# Patient Record
Sex: Female | Born: 1985
Health system: Southern US, Community
[De-identification: ages and names within clinical notes are randomized; demographics above are authoritative.]

## PROBLEM LIST (undated history)

## (undated) DIAGNOSIS — G8929 Other chronic pain: Secondary | ICD-10-CM

## (undated) DIAGNOSIS — Z9889 Other specified postprocedural states: Secondary | ICD-10-CM

## (undated) DIAGNOSIS — F419 Anxiety disorder, unspecified: Secondary | ICD-10-CM

## (undated) DIAGNOSIS — G43909 Migraine, unspecified, not intractable, without status migrainosus: Secondary | ICD-10-CM

## (undated) DIAGNOSIS — G4730 Sleep apnea, unspecified: Secondary | ICD-10-CM

## (undated) DIAGNOSIS — M545 Low back pain, unspecified: Secondary | ICD-10-CM

## (undated) DIAGNOSIS — M542 Cervicalgia: Secondary | ICD-10-CM

## (undated) DIAGNOSIS — M1990 Unspecified osteoarthritis, unspecified site: Secondary | ICD-10-CM

## (undated) DIAGNOSIS — R112 Nausea with vomiting, unspecified: Secondary | ICD-10-CM

## (undated) DIAGNOSIS — F32A Depression, unspecified: Secondary | ICD-10-CM

## (undated) DIAGNOSIS — Z8489 Family history of other specified conditions: Secondary | ICD-10-CM

## (undated) DIAGNOSIS — J189 Pneumonia, unspecified organism: Secondary | ICD-10-CM

## (undated) HISTORY — DX: Pneumonia, unspecified organism: J18.9

## (undated) HISTORY — DX: Low back pain: M54.5

## (undated) HISTORY — PX: ABDOMINAL HYSTERECTOMY: SHX81

## (undated) HISTORY — DX: Depression, unspecified: F32.A

## (undated) HISTORY — DX: Cervicalgia: M54.2

## (undated) HISTORY — DX: Low back pain, unspecified: M54.50

## (undated) HISTORY — DX: Other chronic pain: G89.29

## (undated) HISTORY — DX: Sleep apnea, unspecified: G47.30

## (undated) HISTORY — PX: WISDOM TOOTH EXTRACTION: SHX21

## (undated) HISTORY — DX: Anxiety disorder, unspecified: F41.9

## (undated) HISTORY — DX: Unspecified osteoarthritis, unspecified site: M19.90

---

## 2003-12-04 DIAGNOSIS — J189 Pneumonia, unspecified organism: Secondary | ICD-10-CM

## 2003-12-04 HISTORY — DX: Pneumonia, unspecified organism: J18.9

## 2008-01-15 ENCOUNTER — Emergency Department (HOSPITAL_COMMUNITY): Admission: EM | Admit: 2008-01-15 | Discharge: 2008-01-15 | Payer: Self-pay | Admitting: Emergency Medicine

## 2009-03-22 ENCOUNTER — Emergency Department (HOSPITAL_COMMUNITY): Admission: EM | Admit: 2009-03-22 | Discharge: 2009-03-22 | Payer: Self-pay | Admitting: Emergency Medicine

## 2010-02-13 ENCOUNTER — Emergency Department (HOSPITAL_COMMUNITY): Admission: EM | Admit: 2010-02-13 | Discharge: 2010-02-13 | Payer: Self-pay | Admitting: Emergency Medicine

## 2010-11-09 ENCOUNTER — Emergency Department (HOSPITAL_COMMUNITY): Admission: EM | Admit: 2010-11-09 | Discharge: 2010-01-19 | Payer: Self-pay | Admitting: Emergency Medicine

## 2011-08-24 LAB — RAPID STREP SCREEN (MED CTR MEBANE ONLY): Streptococcus, Group A Screen (Direct): NEGATIVE

## 2011-12-15 ENCOUNTER — Emergency Department (HOSPITAL_COMMUNITY)
Admission: EM | Admit: 2011-12-15 | Discharge: 2011-12-16 | Discharge status: Against Medical Advice | Payer: Self-pay | Attending: Emergency Medicine | Admitting: Emergency Medicine

## 2011-12-15 ENCOUNTER — Encounter (HOSPITAL_COMMUNITY): Payer: Self-pay | Admitting: *Deleted

## 2011-12-15 DIAGNOSIS — J3489 Other specified disorders of nose and nasal sinuses: Secondary | ICD-10-CM | POA: Insufficient documentation

## 2011-12-15 NOTE — ED Notes (Signed)
Reports throat drainage, body aches, dry cough, nasal congestion x several days.  Denies fever/chills.  deneis N/V-reports diarrhea.

## 2011-12-16 LAB — RAPID STREP SCREEN (MED CTR MEBANE ONLY): Streptococcus, Group A Screen (Direct): NEGATIVE

## 2011-12-16 NOTE — ED Notes (Signed)
Patient's friend is upset about the wait time and wants to leave.  Explained to her the acuity of how patient's are seen.  Person has been advised about 4 times and apologized for the wait time.  Asked them if there was anything needed at this time but nothing except wanting to see a provider.  Patient was given a warm blanket and she refused anything to drink or eat.

## 2011-12-16 NOTE — ED Notes (Signed)
Patient not in the room when NP went to see patient.

## 2012-07-12 ENCOUNTER — Emergency Department (HOSPITAL_COMMUNITY)
Admission: EM | Admit: 2012-07-12 | Discharge: 2012-07-12 | Disposition: A | Discharge status: Home / Self Care | Payer: Self-pay | Attending: Emergency Medicine | Admitting: Emergency Medicine

## 2012-07-12 ENCOUNTER — Encounter (HOSPITAL_COMMUNITY): Payer: Self-pay | Admitting: Emergency Medicine

## 2012-07-12 DIAGNOSIS — R51 Headache: Secondary | ICD-10-CM | POA: Insufficient documentation

## 2012-07-12 DIAGNOSIS — F17200 Nicotine dependence, unspecified, uncomplicated: Secondary | ICD-10-CM | POA: Insufficient documentation

## 2012-07-12 HISTORY — DX: Migraine, unspecified, not intractable, without status migrainosus: G43.909

## 2012-07-12 MED ORDER — DIPHENHYDRAMINE HCL 50 MG/ML IJ SOLN
25.0000 mg | Freq: Once | INTRAMUSCULAR | Status: AC
  Administered 2012-07-12: 25 mg via INTRAVENOUS
  Filled 2012-07-12: qty 1

## 2012-07-12 MED ORDER — SODIUM CHLORIDE 0.9 % IV BOLUS (SEPSIS)
1000.0000 mL | Freq: Once | INTRAVENOUS | Status: AC
  Administered 2012-07-12: 1000 mL via INTRAVENOUS

## 2012-07-12 MED ORDER — KETOROLAC TROMETHAMINE 30 MG/ML IJ SOLN
INTRAMUSCULAR | Status: AC
  Administered 2012-07-12: 15 mg
  Filled 2012-07-12: qty 1

## 2012-07-12 MED ORDER — METOCLOPRAMIDE HCL 5 MG/ML IJ SOLN
10.0000 mg | Freq: Once | INTRAMUSCULAR | Status: AC
  Administered 2012-07-12: 10 mg via INTRAVENOUS
  Filled 2012-07-12: qty 2

## 2012-07-12 MED ORDER — KETOROLAC TROMETHAMINE 15 MG/ML IJ SOLN
15.0000 mg | Freq: Once | INTRAMUSCULAR | Status: AC
  Filled 2012-07-12: qty 1

## 2012-07-12 NOTE — ED Notes (Signed)
Pt states she has a migraine headache and is very sensitive to light and her neck is feeling stiff  Denies feeling like she has a fever  Pt states she has had some nausea without vomiting  Pt states sxs started 2 days ago

## 2012-07-12 NOTE — ED Notes (Signed)
Patient is alert and oriented x3.  She was given DC instructions and follow up visit instructions.  Patient gave verbal understanding. She was DC ambulatory under his own power to home.  V/S stable.  He was not showing any signs of distress on DC 

## 2012-07-12 NOTE — ED Notes (Signed)
Patient is alert and oriented x3.  She states she has a history of migraines and this  Feels like her previous pain.  She confirms nausea but denies and vomiting

## 2012-07-22 NOTE — ED Provider Notes (Signed)
History    26yF with HA. Gradual onset about 2d ago. Hx of what she calls migraines and feels similar to previous. Denies trauma. Diffuse and achy. Nausea. No vomiting. Photophobia. No numbness, tingling or loss of strength. No fever denies use of blood thinning medication.  CSN: [id number]  Arrival date & time 07/12/12  0506   First MD Initiated Contact with Patient 07/12/12 0600      Chief Complaint  Patient presents with  . Neck Pain  . Headache    (Consider location/radiation/quality/duration/timing/severity/associated sxs/prior treatment) HPI  Past Medical History  Diagnosis Date  . Migraines     History reviewed. No pertinent past surgical history.  Family History  Problem Relation Age of Onset  . Hypertension Other   . Diabetes Other     History  Substance Use Topics  . Smoking status: Current Everyday Smoker -- 0.2 packs/day    Types: Cigarettes  . Smokeless tobacco: Not on file  . Alcohol Use: Yes     occ    OB History    Grav Para Term Preterm Abortions TAB SAB Ect Mult Living                  Review of Systems   Review of symptoms negative unless otherwise noted in HPI.   Allergies  Review of patient's allergies indicates no known allergies.  Home Medications   Current Outpatient Rx  Name Route Sig Dispense Refill  . IBUPROFEN 200 MG PO TABS Oral Take 800 mg by mouth every 6 (six) hours as needed. For cramps      BP 129/98  Pulse 80  Temp 98.2 F (36.8 C) (Oral)  Resp 16  SpO2 100%  LMP 07/09/2012  Physical Exam  Nursing note and vitals reviewed. Constitutional: She is oriented to person, place, and time. She appears well-developed and well-nourished. No distress.  HENT:  Head: Normocephalic and atraumatic.  Right Ear: External ear normal.  Left Ear: External ear normal.  Mouth/Throat: Oropharynx is clear and moist.  Eyes: Conjunctivae and EOM are normal. Pupils are equal, round, and reactive to light. Right eye exhibits no  discharge. Left eye exhibits no discharge.  Neck: Normal range of motion. Neck supple.  Cardiovascular: Normal rate, regular rhythm and normal heart sounds.  Exam reveals no gallop and no friction rub.   No murmur heard. Pulmonary/Chest: Effort normal and breath sounds normal. No respiratory distress.  Abdominal: Soft. She exhibits no distension. There is no tenderness.  Musculoskeletal: She exhibits no edema and no tenderness.  Lymphadenopathy:    She has cervical adenopathy.  Neurological: She is alert and oriented to person, place, and time. No cranial nerve deficit. She exhibits normal muscle tone. Coordination normal.       Good finger to nose b/l. Gait steady.  Skin: Skin is warm and dry.  Psychiatric: She has a normal mood and affect. Her behavior is normal. Thought content normal.    ED Course  Procedures (including critical care time)  Labs Reviewed - No data to display No results found.   1. Headache       MDM  26yF with HA. Suspect primary HA. Consider emergent secondary causes such as bleed, infectious or mass but doubt. There is no history of trauma. Pt has a nonfocal neurological exam. Afebrile and neck supple. No use of blood thinning medication. Consider ocular etiology such as acute angle closure glaucoma but doubt. Pt denies acute change in visual acuity and eye exam unremarkable. Doubt  temporal arteritis given age, no temporal tenderness and temporal artery pulsations palpable. Doubt CO poisoning. No contacts with similar symptoms. Doubt venous thrombosis. Doubt carotid or vertebral arteries dissection. Symptoms improved with meds. Feel that can be safely discharged, but strict return precautions discussed. Outpt fu.         Raeford Razor, MD 07/22/12 1414

## 2013-07-15 ENCOUNTER — Encounter (HOSPITAL_COMMUNITY): Payer: Self-pay | Admitting: Emergency Medicine

## 2013-07-15 ENCOUNTER — Emergency Department (INDEPENDENT_AMBULATORY_CARE_PROVIDER_SITE_OTHER)
Admission: EM | Admit: 2013-07-15 | Discharge: 2013-07-15 | Disposition: A | Discharge status: Home / Self Care | Payer: 59 | Source: Home / Self Care

## 2013-07-15 DIAGNOSIS — M549 Dorsalgia, unspecified: Secondary | ICD-10-CM

## 2013-07-15 DIAGNOSIS — S39012S Strain of muscle, fascia and tendon of lower back, sequela: Secondary | ICD-10-CM

## 2013-07-15 DIAGNOSIS — M542 Cervicalgia: Secondary | ICD-10-CM

## 2013-07-15 DIAGNOSIS — G8929 Other chronic pain: Secondary | ICD-10-CM

## 2013-07-15 DIAGNOSIS — M791 Myalgia, unspecified site: Secondary | ICD-10-CM

## 2013-07-15 DIAGNOSIS — IMO0002 Reserved for concepts with insufficient information to code with codable children: Secondary | ICD-10-CM

## 2013-07-15 DIAGNOSIS — IMO0001 Reserved for inherently not codable concepts without codable children: Secondary | ICD-10-CM

## 2013-07-15 MED ORDER — TRAMADOL HCL 50 MG PO TABS: 50.0000 mg | ORAL_TABLET | Freq: Four times a day (QID) | ORAL | Status: DC | PRN

## 2013-07-15 MED ORDER — DICLOFENAC POTASSIUM 50 MG PO TABS: 50.0000 mg | ORAL_TABLET | Freq: Three times a day (TID) | ORAL | Status: DC

## 2013-07-15 NOTE — ED Provider Notes (Signed)
Medical screening examination/treatment/procedure(s) were performed by non-physician practitioner and as supervising physician I was immediately available for consultation/collaboration.  Raynald Blend, MD 07/15/13 [phone]

## 2013-07-15 NOTE — ED Notes (Signed)
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

## 2013-07-15 NOTE — ED Provider Notes (Signed)
CSN: [id number]     Arrival date & time 07/15/13  1510 History     First MD Initiated Contact with Patient 07/15/13 1559     Chief Complaint  Patient presents with  . Back Pain   (Consider location/radiation/quality/duration/timing/severity/associated sxs/prior Treatment) HPI Comments: 27 year old female with a 7 year history of parathoracic and para lumbar muscular pain. She is complaining of an exacerbation of pain in his muscles as well as paracervical musculature. She states she strained her back working for [employer] apparently 7 years ago and the pain is chronic intermittent throughout the shears. Her current job requires prolonged standing which exacerbates the pain. Is also worse with pushing and pulling. Denies radicular pain paresthesias or focal muscular weakness. Denies spinal pain or history of injury.   Past Medical History  Diagnosis Date  . Migraines    History reviewed. No pertinent past surgical history. Family History  Problem Relation Age of Onset  . Hypertension Other   . Diabetes Other    History  Substance Use Topics  . Smoking status: Current Every Day Smoker -- 0.20 packs/day    Types: Cigarettes  . Smokeless tobacco: Not on file  . Alcohol Use: Yes     Comment: occ   OB History   Grav Para Term Preterm Abortions TAB SAB Ect Mult Living                 Review of Systems  Constitutional: Positive for activity change. Negative for fever, chills and fatigue.  HENT: Negative.   Respiratory: Negative.   Cardiovascular: Negative.   Musculoskeletal: Positive for myalgias and back pain. Negative for joint swelling.       As per HPI  Skin: Negative for color change, pallor and rash.  Neurological: Negative.     Allergies  Review of patient's allergies indicates no known allergies.  Home Medications   Current Outpatient Rx  Name  Route  Sig  Dispense  Refill  . diclofenac (CATAFLAM) 50 MG tablet   Oral   Take 1 tablet (50 mg total) by mouth 3 (three)  times daily.   21 tablet   0   . ibuprofen (ADVIL,MOTRIN) 200 MG tablet   Oral   Take 800 mg by mouth every 6 (six) hours as needed. For cramps         . traMADol (ULTRAM) 50 MG tablet   Oral   Take 1 tablet (50 mg total) by mouth every 6 (six) hours as needed for pain.   15 tablet   0    BP 118/85  Pulse 78  Temp(Src) 99.2 F (37.3 C) (Oral)  Resp 16  SpO2 100% Physical Exam  Constitutional: She is oriented to person, place, and time. She appears well-developed and well-nourished. No distress.  HENT:  Head: Normocephalic and atraumatic.  Eyes: EOM are normal. Pupils are equal, round, and reactive to light.  Neck: Normal range of motion. Neck supple.  Musculoskeletal: Normal range of motion. She exhibits no edema.  Tenderness along the para lumbar and lower thoracic musculature. No spinal tenderness. Tenderness in the para cervical musculature primarily to the trapezii. No spinal tenderness. No spinal deformity or step off deformity palpated along the length of the spine. Range of motion of the neck is complete. Range of motion of the lower back is also complete but having some discomfort with forward flexion past 60.  Lymphadenopathy:    She has no cervical adenopathy.  Neurological: She is alert and oriented to person,  place, and time. No cranial nerve deficit.  Skin: Skin is warm and dry.  Psychiatric: She has a normal mood and affect.    ED Course   Procedures (including critical care time)  Labs Reviewed - No data to display No results found. 1. Chronic back pain   2. Lumbosacral strain, sequela   3. Cervicalgia   4. Myalgia     MDM  Ultram 50 mg every 6 hours when necessary pain Cataflam 50 mg 3 times a day with food when necessary pain Warm compresses to areas of soreness Limit bending or pulling as much as possible. Perform stretches frequently during the day as discussed and demonstrated. Follow instructions on the papers describing back exercises.  Recommend obtaining a PCP for followup and referral to physical therapy for acute on chronic back pain due to musculoskeletal strain and poor posture.     Hayden Rasmussen, NP 07/15/13 [phone]

## 2013-09-17 ENCOUNTER — Encounter (HOSPITAL_COMMUNITY): Payer: Self-pay | Admitting: Emergency Medicine

## 2013-09-17 ENCOUNTER — Emergency Department (HOSPITAL_COMMUNITY)
Admission: EM | Admit: 2013-09-17 | Discharge: 2013-09-18 | Disposition: A | Discharge status: Home / Self Care | Payer: 59 | Attending: Emergency Medicine | Admitting: Emergency Medicine

## 2013-09-17 DIAGNOSIS — N730 Acute parametritis and pelvic cellulitis: Secondary | ICD-10-CM | POA: Insufficient documentation

## 2013-09-17 DIAGNOSIS — Z8679 Personal history of other diseases of the circulatory system: Secondary | ICD-10-CM | POA: Insufficient documentation

## 2013-09-17 DIAGNOSIS — N390 Urinary tract infection, site not specified: Secondary | ICD-10-CM | POA: Insufficient documentation

## 2013-09-17 DIAGNOSIS — Z792 Long term (current) use of antibiotics: Secondary | ICD-10-CM | POA: Insufficient documentation

## 2013-09-17 DIAGNOSIS — R1110 Vomiting, unspecified: Secondary | ICD-10-CM | POA: Insufficient documentation

## 2013-09-17 DIAGNOSIS — Z3202 Encounter for pregnancy test, result negative: Secondary | ICD-10-CM | POA: Insufficient documentation

## 2013-09-17 DIAGNOSIS — F17200 Nicotine dependence, unspecified, uncomplicated: Secondary | ICD-10-CM | POA: Insufficient documentation

## 2013-09-17 LAB — URINALYSIS, ROUTINE W REFLEX MICROSCOPIC
Glucose, UA: NEGATIVE mg/dL
Specific Gravity, Urine: 1.027 (ref 1.005–1.030)
Urobilinogen, UA: 1 mg/dL (ref 0.0–1.0)
pH: 8 (ref 5.0–8.0)

## 2013-09-17 LAB — URINE MICROSCOPIC-ADD ON

## 2013-09-17 LAB — CBC WITH DIFFERENTIAL/PLATELET
Basophils Absolute: 0 10*3/uL (ref 0.0–0.1)
Basophils Relative: 0 % (ref 0–1)
Eosinophils Absolute: 0 10*3/uL (ref 0.0–0.7)
Eosinophils Relative: 0 % (ref 0–5)
MCH: 31.5 pg (ref 26.0–34.0)
MCV: 90.4 fL (ref 78.0–100.0)
Neutrophils Relative %: 84 % — ABNORMAL HIGH (ref 43–77)
Platelets: 153 10*3/uL (ref 150–400)
RBC: 4.06 MIL/uL (ref 3.87–5.11)
RDW: 13.4 % (ref 11.5–15.5)
WBC: 9.6 10*3/uL (ref 4.0–10.5)

## 2013-09-17 LAB — COMPREHENSIVE METABOLIC PANEL
ALT: 12 U/L (ref 0–35)
AST: 17 U/L (ref 0–37)
Albumin: 4.1 g/dL (ref 3.5–5.2)
Alkaline Phosphatase: 34 U/L — ABNORMAL LOW (ref 39–117)
Calcium: 9.1 mg/dL (ref 8.4–10.5)
GFR calc Af Amer: >90 mL/min (ref >90)
Potassium: 4.2 mEq/L (ref 3.5–5.1)
Sodium: 139 mEq/L (ref 135–145)
Total Protein: 7.2 g/dL (ref 6.0–8.3)

## 2013-09-17 LAB — POCT PREGNANCY, URINE: Preg Test, Ur: NEGATIVE

## 2013-09-17 MED ORDER — SODIUM CHLORIDE 0.9 % IV BOLUS (SEPSIS)
1000.0000 mL | Freq: Once | INTRAVENOUS | Status: AC
  Administered 2013-09-17: 1000 mL via INTRAVENOUS

## 2013-09-17 MED ORDER — ONDANSETRON HCL 4 MG/2ML IJ SOLN
4.0000 mg | Freq: Once | INTRAMUSCULAR | Status: AC
  Administered 2013-09-17: 4 mg via INTRAVENOUS
  Filled 2013-09-17: qty 2

## 2013-09-17 MED ORDER — ONDANSETRON 4 MG PO TBDP: 8.0000 mg | ORAL_TABLET | Freq: Once | ORAL | Status: DC

## 2013-09-17 MED ORDER — MORPHINE SULFATE 4 MG/ML IJ SOLN
4.0000 mg | Freq: Once | INTRAMUSCULAR | Status: AC
  Administered 2013-09-17: 4 mg via INTRAVENOUS
  Filled 2013-09-17: qty 1

## 2013-09-17 MED ORDER — DEXTROSE 5 % IV SOLN
1.0000 g | Freq: Once | INTRAVENOUS | Status: AC
  Administered 2013-09-18: 1 g via INTRAVENOUS
  Filled 2013-09-17: qty 10

## 2013-09-17 NOTE — ED Notes (Signed)
Severe abd. Cramps.  menstrual cycle started today.  Never had pain like this before.

## 2013-09-17 NOTE — ED Notes (Signed)
Pt unable to void 

## 2013-09-17 NOTE — ED Provider Notes (Signed)
CSN: [id number]     Arrival date & time 09/17/13  1923 History   First MD Initiated Contact with Patient 09/17/13 2240     Chief Complaint  Patient presents with  . Abdominal Pain   (Consider location/radiation/quality/duration/timing/severity/associated sxs/prior Treatment) HPI  Sarah Greene is a 27 y.o. female complaining of severe lower abdominal pain, suprapubic associated with multiple episodes of nonbloody, nonbilious, no coffee ground emesis starting today. Patient denies dysuria but endorses urinary frequency, denies flank pain, abnormal vaginal discharge (she started her menstruation today). She also denies fever, sick contacts, change in bowel habits, chest pain or shortness of breath. She is not concerned about STDs.   Past Medical History  Diagnosis Date  . Migraines    History reviewed. No pertinent past surgical history. Family History  Problem Relation Age of Onset  . Hypertension Other   . Diabetes Other    History  Substance Use Topics  . Smoking status: Current Every Day Smoker -- 0.20 packs/day    Types: Cigarettes  . Smokeless tobacco: Not on file  . Alcohol Use: Yes     Comment: occ   OB History   Grav Para Term Preterm Abortions TAB SAB Ect Mult Living                 Review of Systems 10 systems reviewed and found to be negative, except as noted in the HPI   Allergies  Review of patient's allergies indicates no known allergies.  Home Medications   Current Outpatient Rx  Name  Route  Sig  Dispense  Refill  . ibuprofen (ADVIL,MOTRIN) 200 MG tablet   Oral   Take 800 mg by mouth every 6 (six) hours as needed for pain. For cramps         . cephALEXin (KEFLEX) 500 MG capsule   Oral   Take 1 capsule (500 mg total) by mouth 2 (two) times daily.   20 capsule   0   . doxycycline (VIBRAMYCIN) 100 MG capsule   Oral   Take 1 capsule (100 mg total) by mouth 2 (two) times daily.   20 capsule   0   . metroNIDAZOLE (FLAGYL) 500 MG tablet    Oral   Take 1 tablet (500 mg total) by mouth 3 (three) times daily.   14 tablet   0   . oxyCODONE-acetaminophen (PERCOCET/ROXICET) 5-325 MG per tablet      1 to 2 tabs PO q6hrs  PRN for pain   15 tablet   0   . promethazine (PHENERGAN) 25 MG tablet   Oral   Take 1 tablet (25 mg total) by mouth every 6 (six) hours as needed for nausea.   12 tablet   0    BP 119/83  Pulse 76  Temp(Src) 98 F (36.7 C) (Oral)  Resp 19  Ht 5\' 8"  (1.727 m)  Wt 119 lb (53.978 kg)  BMI 18.1 kg/m2  SpO2 100%  LMP 09/17/2013 Physical Exam  Nursing note and vitals reviewed. Constitutional: She is oriented to person, place, and time. She appears well-developed and well-nourished. No distress.  HENT:  Head: Normocephalic.  Mouth/Throat: Oropharynx is clear and moist.  Eyes: Conjunctivae and EOM are normal. Pupils are equal, round, and reactive to light.  Cardiovascular: Normal rate.   Pulmonary/Chest: Effort normal and breath sounds normal. No stridor. No respiratory distress. She has no wheezes. She has no rales. She exhibits no tenderness.  Abdominal: Soft. Bowel sounds are normal. She exhibits no  distension and no mass. There is tenderness. There is no rebound and no guarding.  Mild suprapubic tenderness with no guarding or rebound. There is no tenderness to palpation over McBurney's point. Rovsing, obturator and psoas signs are negative.  Genitourinary:  No CVA tenderness bilaterally.  To exam chaperoned by nurse: There are no rashes or lesions. There is scant dark blood in the posterior fourchette. MILD/Moderate CMT with no adnexal TTP.   Musculoskeletal: Normal range of motion.  Neurological: She is alert and oriented to person, place, and time.  Psychiatric: She has a normal mood and affect.    ED Course  Procedures (including critical care time) Labs Review Labs Reviewed  WET PREP, GENITAL - Abnormal; Notable for the following:    Clue Cells Wet Prep HPF POC FEW (*)    WBC, Wet Prep  HPF POC MODERATE (*)    All other components within normal limits  CBC WITH DIFFERENTIAL - Abnormal; Notable for the following:    Neutrophils Relative % 84 (*)    Neutro Abs 8.0 (*)    Lymphocytes Relative 9 (*)    All other components within normal limits  COMPREHENSIVE METABOLIC PANEL - Abnormal; Notable for the following:    Glucose, Bld 113 (*)    Alkaline Phosphatase 34 (*)    All other components within normal limits  URINALYSIS, ROUTINE W REFLEX MICROSCOPIC - Abnormal; Notable for the following:    APPearance CLOUDY (*)    Hgb urine dipstick LARGE (*)    Ketones, ur 15 (*)    Leukocytes, UA SMALL (*)    All other components within normal limits  URINE MICROSCOPIC-ADD ON - Abnormal; Notable for the following:    Bacteria, UA MANY (*)    All other components within normal limits  GC/CHLAMYDIA PROBE AMP  URINE CULTURE  LIPASE, BLOOD  POCT PREGNANCY, URINE   Imaging Review No results found.  EKG Interpretation   None       MDM   1. UTI (lower urinary tract infection)   2. PID (acute pelvic inflammatory disease)   3. Emesis     Filed Vitals:   09/18/13 0030 09/18/13 0045 09/18/13 0100 09/18/13 0115  BP: 121/78 110/63 113/72 119/83  Pulse: 86 78 75 76  Temp:      TempSrc:      Resp:      Height:      Weight:      SpO2: 100% 100% 100% 100%     Sarah Greene is a 27 y.o. female with lower, midline abdominal pain, N/V and urinary frequency. UA c/w UTI, no CVA TTP. Pelvic exam with CMT and wet prep with moderate WBC. D/w pt and will treated for PID. Explained clinical Dx and may not have STD. Pt also has a few clue cells, will cover for BV with flagyl from PID Tx. Urine Cx and gc/claymia pending. Pt is tolerating PO and appropriate for outpatient Tx.   Medications  sodium chloride 0.9 % bolus 1,000 mL (0 mLs Intravenous Stopped 09/18/13 0104)  ondansetron (ZOFRAN) injection 4 mg (4 mg Intravenous Given 09/17/13 2337)  morphine 4 MG/ML injection 4 mg (4 mg  Intravenous Given 09/17/13 2337)  cefTRIAXone (ROCEPHIN) 1 g in dextrose 5 % 50 mL IVPB (0 g Intravenous Stopped 09/18/13 0104)  metroNIDAZOLE (FLAGYL) tablet 500 mg (500 mg Oral Given 09/18/13 0104)  doxycycline (VIBRA-TABS) tablet 100 mg (100 mg Oral Given 09/18/13 0104)    Pt is hemodynamically stable, appropriate for, and  amenable to discharge at this time. Pt verbalized understanding and agrees with care plan. All questions answered. Outpatient follow-up and specific return precautions discussed.    Discharge Medication List as of 09/18/2013  1:09 AM    START taking these medications   Details  cephALEXin (KEFLEX) 500 MG capsule Take 1 capsule (500 mg total) by mouth 2 (two) times daily., Starting 09/18/2013, Until Discontinued, Print    doxycycline (VIBRAMYCIN) 100 MG capsule Take 1 capsule (100 mg total) by mouth 2 (two) times daily., Starting 09/18/2013, Until Discontinued, Print    metroNIDAZOLE (FLAGYL) 500 MG tablet Take 1 tablet (500 mg total) by mouth 3 (three) times daily., Starting 09/18/2013, Until Discontinued, Print    oxyCODONE-acetaminophen (PERCOCET/ROXICET) 5-325 MG per tablet 1 to 2 tabs PO q6hrs  PRN for pain, Print    promethazine (PHENERGAN) 25 MG tablet Take 1 tablet (25 mg total) by mouth every 6 (six) hours as needed for nausea., Starting 09/18/2013, Until Discontinued, Print        Note: Portions of this report may have been transcribed using voice recognition software. Every effort was made to ensure accuracy; however, inadvertent computerized transcription errors may be present     Wynetta Emery, PA-C 09/18/13 1202

## 2013-09-18 LAB — WET PREP, GENITAL
Trich, Wet Prep: NONE SEEN
Yeast Wet Prep HPF POC: NONE SEEN

## 2013-09-18 MED ORDER — CEPHALEXIN 500 MG PO CAPS: 500.0000 mg | ORAL_CAPSULE | Freq: Two times a day (BID) | ORAL | Status: DC

## 2013-09-18 MED ORDER — PROMETHAZINE HCL 25 MG PO TABS: 25.0000 mg | ORAL_TABLET | Freq: Four times a day (QID) | ORAL | Status: DC | PRN

## 2013-09-18 MED ORDER — DOXYCYCLINE HYCLATE 100 MG PO CAPS: 100.0000 mg | ORAL_CAPSULE | Freq: Two times a day (BID) | ORAL | Status: DC

## 2013-09-18 MED ORDER — OXYCODONE-ACETAMINOPHEN 5-325 MG PO TABS: ORAL_TABLET | ORAL | Status: DC

## 2013-09-18 MED ORDER — DOXYCYCLINE HYCLATE 100 MG PO TABS
100.0000 mg | ORAL_TABLET | Freq: Once | ORAL | Status: AC
  Administered 2013-09-18: 100 mg via ORAL
  Filled 2013-09-18: qty 1

## 2013-09-18 MED ORDER — METRONIDAZOLE 500 MG PO TABS
500.0000 mg | ORAL_TABLET | Freq: Once | ORAL | Status: AC
  Administered 2013-09-18: 500 mg via ORAL
  Filled 2013-09-18: qty 1

## 2013-09-18 MED ORDER — METRONIDAZOLE 500 MG PO TABS: 500.0000 mg | ORAL_TABLET | Freq: Three times a day (TID) | ORAL | Status: DC

## 2013-09-19 ENCOUNTER — Telehealth (HOSPITAL_COMMUNITY): Payer: Self-pay | Admitting: Emergency Medicine

## 2013-09-19 LAB — URINE CULTURE: Colony Count: NO GROWTH

## 2013-09-21 NOTE — ED Provider Notes (Signed)
Medical screening examination/treatment/procedure(s) were performed by non-physician practitioner and as supervising physician I was immediately available for consultation/collaboration.    Vida Roller, MD 09/21/13 [phone]

## 2014-06-16 ENCOUNTER — Emergency Department (INDEPENDENT_AMBULATORY_CARE_PROVIDER_SITE_OTHER)
Admission: EM | Admit: 2014-06-16 | Discharge: 2014-06-16 | Disposition: A | Discharge status: Home / Self Care | Payer: 59 | Source: Home / Self Care

## 2014-06-16 ENCOUNTER — Encounter (HOSPITAL_COMMUNITY): Payer: Self-pay | Admitting: Emergency Medicine

## 2014-06-16 DIAGNOSIS — Z0000 Encounter for general adult medical examination without abnormal findings: Secondary | ICD-10-CM

## 2014-06-16 DIAGNOSIS — B379 Candidiasis, unspecified: Secondary | ICD-10-CM

## 2014-06-16 LAB — POCT I-STAT, CHEM 8
BUN: 9 mg/dL (ref 6–23)
BUN: 10 mg/dL (ref 6–23)
CALCIUM ION: 1.24 mmol/L — AB (ref 1.12–1.23)
Calcium, Ion: 1.29 mmol/L — ABNORMAL HIGH (ref 1.12–1.23)
Chloride: 105 mEq/L (ref 96–112)
Chloride: 105 mEq/L (ref 96–112)
Creatinine, Ser: 0.9 mg/dL (ref 0.50–1.10)
Creatinine, Ser: 0.9 mg/dL (ref 0.50–1.10)
GLUCOSE: 80 mg/dL (ref 70–99)
Glucose, Bld: 78 mg/dL (ref 70–99)
HCT: 39 % (ref 36.0–46.0)
HEMATOCRIT: 40 % (ref 36.0–46.0)
Hemoglobin: 13.6 g/dL (ref 12.0–15.0)
Hemoglobin: 13.3 g/dL (ref 12.0–15.0)
Potassium: 4.2 mEq/L (ref 3.7–5.3)
Potassium: 4.2 mEq/L (ref 3.7–5.3)
Sodium: 141 mEq/L (ref 137–147)
Sodium: 141 mEq/L (ref 137–147)
TCO2: 26 mmol/L (ref 0–100)
TCO2: 27 mmol/L (ref 0–100)

## 2014-06-16 NOTE — Discharge Instructions (Signed)
Go to www.goodrx.com to look up your medications. This will give you a list of where you can find your prescriptions at the most affordable prices.   If you have no primary doctor, here are some resources that may be helpful:  [place] Providers: - [facility]- [address]  [phone];   - [place], [place] [phone]  - [place], [address] [phone] - [place]  [phone]  - Lucianne Lei- [address], [address] [phone]  Only accepts [place] patients       after they have her name applied to their card  -Dr. Benito Mccreedy, [facility]. [place]    [place], [place] [address]  [phone]  Self Pay (no insurance) in [place]: - Sickle Cell Patients: Dr Kevan Ny, [facility] [place] [facility]  - Physician Referral Service- [facility]- [address], [address], [place], [phone];  Monday to Friday, 9 a.m. - 7 p.m.; Saturday 9 a.m. to 1 p.m.  [facility]- [address] [place], [place]      [facility]- [address] [phone]  -[facility], [place] [place]; [phone]; or [address] [place]; [phone].   [employer] [facility] [place], [place] [address] [place]; [phone]; Monday to Wednesday, 8:30 a.m. - 5 p.m.; Thursday, 8:30 a.m. - 8 p.m.  [facility], [place], [place]; [phone]; Monday to Friday, 8 a.m. - 4:30 p.m.   [facility], [place] [employer] [place] [address], [place], [place]; first and third Saturday of the month, 9:30 a.m. - 12:30  p.m.  [facility], [address] [place], [place]; second Saturday of the month, 9 a.m. -noon.  [place] for children. For information, call [phone]; [phone]; or [phone].  Other agencies that provide inexpensive medical care:     [facility]  [facility]  [phone]    [facility]  [phone] [place]    [facility]  [phone]    [facility]  [phone], [phone]; or [phone].  Chronic Pain Problems Contact [facility]  [phone] Patients need to be referred by their primary care doctor.  [facility] [address] [place]   [phone] (After Hours)  General Information: Finding a doctor when you do not have health insurance can be tricky. Although you are not limited by an insurance plan, you are of course limited by her finances and how much but he can pay out of pocket.  What are your options if you don't  have health insurance?   1) Find a Tax adviser and Pay Out of Pocket Although you won't have to find out who is covered by your insurance plan, it is a good idea to ask around and get recommendations. You will then need to call the office and see if the doctor you have chosen will accept you as a new patient and what types of options they offer for patients who are self-pay. Some doctors offer discounts or will set up payment plans for their patients who do not have insurance, but you will need to ask so you aren't surprised when you get to your appointment.  2) Contact Your Local Health Department Not all health departments have doctors that can see patients for sick visits, but many do, so it is worth a call to see if yours does. If you don't know  where your local health department is, you can check in your phone book. The CDC also has a tool to help you locate your state's health department, and many state websites also have listings of all of their local health departments.  3) Find a [facility] If your illness is not likely to be very severe or complicated, you may want to try a walk in clinic. These are popping up all over the country in pharmacies, drugstores, and shopping centers. They're usually staffed by nurse practitioners or physician assistants that have been trained to treat common illnesses and complaints. They're usually fairly quick and inexpensive. However, if you have serious medical issues or chronic medical problems, these are probably not your best option

## 2014-06-16 NOTE — ED Notes (Signed)
Pt reports having recurrent yeast infections and fatigue.  Pt has concerns of possibly having diabetes. States family hx grandmother and aunt.    Symptoms present for the past three months.

## 2014-06-16 NOTE — ED Provider Notes (Signed)
CSN: [id number]     Arrival date & time 06/16/14  1611 History   None    Chief Complaint  Patient presents with  . Diabetes    requesting check for diabetes   (Consider location/radiation/quality/duration/timing/severity/associated sxs/prior Treatment) Patient is a 28 y.o. female presenting with diabetes problem. The history is provided by the patient.  Diabetes This is a new problem. Episode onset: concern b/o yeast infections and pos  f/hx of dm.  The problem has not changed since onset.Pertinent negatives include no abdominal pain.    Past Medical History  Diagnosis Date  . Migraines    History reviewed. No pertinent past surgical history. Family History  Problem Relation Age of Onset  . Hypertension Other   . Diabetes Other    History  Substance Use Topics  . Smoking status: Current Every Day Smoker -- 0.20 packs/day    Types: Cigarettes  . Smokeless tobacco: Not on file  . Alcohol Use: Yes     Comment: occ   OB History   Grav Para Term Preterm Abortions TAB SAB Ect Mult Living                 Review of Systems  Constitutional: Negative.   Gastrointestinal: Negative for abdominal pain.  Endocrine: Negative.  Negative for polydipsia, polyphagia and polyuria.  Genitourinary: Negative.     Allergies  Review of patient's allergies indicates no known allergies.  Home Medications   Prior to Admission medications   Medication Sig Start Date End Date Taking? Authorizing Provider  cephALEXin (KEFLEX) 500 MG capsule Take 1 capsule (500 mg total) by mouth 2 (two) times daily. 09/18/13   Nicole Pisciotta, PA-C  doxycycline (VIBRAMYCIN) 100 MG capsule Take 1 capsule (100 mg total) by mouth 2 (two) times daily. 09/18/13   Nicole Pisciotta, PA-C  ibuprofen (ADVIL,MOTRIN) 200 MG tablet Take 800 mg by mouth every 6 (six) hours as needed for pain. For cramps    Historical Provider, MD  metroNIDAZOLE (FLAGYL) 500 MG tablet Take 1 tablet (500 mg total) by mouth 3 (three) times  daily. 09/18/13   Nicole Pisciotta, PA-C  oxyCODONE-acetaminophen (PERCOCET/ROXICET) 5-325 MG per tablet 1 to 2 tabs PO q6hrs  PRN for pain 09/18/13   Elmyra Ricks Pisciotta, PA-C  promethazine (PHENERGAN) 25 MG tablet Take 1 tablet (25 mg total) by mouth every 6 (six) hours as needed for nausea. 09/18/13   Nicole Pisciotta, PA-C   BP 129/86  Pulse 72  Temp(Src) 98.4 F (36.9 C) (Oral)  Resp 14  SpO2 100%  LMP 06/13/2014 Physical Exam  Nursing note and vitals reviewed. Constitutional: She is oriented to person, place, and time. She appears well-developed and well-nourished. No distress.  Neck: Normal range of motion. Neck supple.  Cardiovascular: Normal heart sounds.   Pulmonary/Chest: Effort normal.  Lymphadenopathy:    She has no cervical adenopathy.  Neurological: She is alert and oriented to person, place, and time.    ED Course  Procedures (including critical care time) Labs Review Labs Reviewed - No data to display i-stat wnl. Imaging Review No results found.   MDM   1. Well adult health check        Billy Fischer, MD 06/16/14 [phone]

## 2014-11-10 ENCOUNTER — Emergency Department (HOSPITAL_COMMUNITY)
Admission: EM | Admit: 2014-11-10 | Discharge: 2014-11-10 | Disposition: A | Discharge status: Home / Self Care | Payer: 59 | Attending: Emergency Medicine | Admitting: Emergency Medicine

## 2014-11-10 ENCOUNTER — Encounter (HOSPITAL_COMMUNITY): Payer: Self-pay | Admitting: Emergency Medicine

## 2014-11-10 DIAGNOSIS — M542 Cervicalgia: Secondary | ICD-10-CM | POA: Insufficient documentation

## 2014-11-10 DIAGNOSIS — Z792 Long term (current) use of antibiotics: Secondary | ICD-10-CM | POA: Insufficient documentation

## 2014-11-10 DIAGNOSIS — R0789 Other chest pain: Secondary | ICD-10-CM

## 2014-11-10 DIAGNOSIS — Z8679 Personal history of other diseases of the circulatory system: Secondary | ICD-10-CM | POA: Insufficient documentation

## 2014-11-10 DIAGNOSIS — Z720 Tobacco use: Secondary | ICD-10-CM | POA: Insufficient documentation

## 2014-11-10 MED ORDER — HYDROCODONE-ACETAMINOPHEN 5-325 MG PO TABS
1.0000 | ORAL_TABLET | Freq: Once | ORAL | Status: AC
  Administered 2014-11-10: 1 via ORAL
  Filled 2014-11-10: qty 1

## 2014-11-10 MED ORDER — CYCLOBENZAPRINE HCL 5 MG PO TABS: 10.0000 mg | ORAL_TABLET | Freq: Three times a day (TID) | ORAL | Status: DC

## 2014-11-10 MED ORDER — HYDROCODONE-ACETAMINOPHEN 5-325 MG PO TABS: 1.0000 | ORAL_TABLET | ORAL | Status: DC | PRN

## 2014-11-10 MED ORDER — IBUPROFEN 800 MG PO TABS
800.0000 mg | ORAL_TABLET | Freq: Once | ORAL | Status: AC
  Administered 2014-11-10: 800 mg via ORAL
  Filled 2014-11-10: qty 1

## 2014-11-10 MED ORDER — CYCLOBENZAPRINE HCL 10 MG PO TABS
5.0000 mg | ORAL_TABLET | Freq: Once | ORAL | Status: AC
  Administered 2014-11-10: 5 mg via ORAL
  Filled 2014-11-10: qty 1

## 2014-11-10 MED ORDER — IBUPROFEN 800 MG PO TABS: 800.0000 mg | ORAL_TABLET | Freq: Three times a day (TID) | ORAL | Status: DC

## 2014-11-10 NOTE — Discharge Instructions (Signed)
Chest Wall Pain Chest wall pain is pain in or around the bones and muscles of your chest. It may take up to 6 weeks to get better. It may take longer if you must stay physically active in your work and activities.  CAUSES  Chest wall pain may happen on its own. However, it may be caused by:  A viral illness like the flu.  Injury.  Coughing.  Exercise.  Arthritis.  Fibromyalgia.  Shingles. HOME CARE INSTRUCTIONS   Avoid overtiring physical activity. Try not to strain or perform activities that cause pain. This includes any activities using your chest or your abdominal and side muscles, especially if heavy weights are used.  Put ice on the sore area.  Put ice in a plastic bag.  Place a towel between your skin and the bag.  Leave the ice on for 15-20 minutes per hour while awake for the first 2 days.  Only take over-the-counter or prescription medicines for pain, discomfort, or fever as directed by your caregiver. SEEK IMMEDIATE MEDICAL CARE IF:   Your pain increases, or you are very uncomfortable.  You have a fever.  Your chest pain becomes worse.  You have new, unexplained symptoms.  You have nausea or vomiting.  You feel sweaty or lightheaded.  You have a cough with phlegm (sputum), or you cough up blood. MAKE SURE YOU:   Understand these instructions.  Will watch your condition.  Will get help right away if you are not doing well or get worse. Document Released: 11/19/2005 Document Revised: 02/11/2012 Document Reviewed: 07/16/2011 [facility] Patient Information 2015 [place], [place]. This information is not intended to replace advice given to you by your health care provider. Make sure you discuss any questions you have with your health care provider.  Chest Wall Pain Chest wall pain is pain in or around the bones and muscles of your chest. It may take up to 6 weeks to get better. It may take longer if you must stay physically active in your work and activities.   CAUSES  Chest wall pain may happen on its own. However, it may be caused by:  A viral illness like the flu.  Injury.  Coughing.  Exercise.  Arthritis.  Fibromyalgia.  Shingles. HOME CARE INSTRUCTIONS   Avoid overtiring physical activity. Try not to strain or perform activities that cause pain. This includes any activities using your chest or your abdominal and side muscles, especially if heavy weights are used.  Put ice on the sore area.  Put ice in a plastic bag.  Place a towel between your skin and the bag.  Leave the ice on for 15-20 minutes per hour while awake for the first 2 days.  Only take over-the-counter or prescription medicines for pain, discomfort, or fever as directed by your caregiver. SEEK IMMEDIATE MEDICAL CARE IF:   Your pain increases, or you are very uncomfortable.  You have a fever.  Your chest pain becomes worse.  You have new, unexplained symptoms.  You have nausea or vomiting.  You feel sweaty or lightheaded.  You have a cough with phlegm (sputum), or you cough up blood. MAKE SURE YOU:   Understand these instructions.  Will watch your condition.  Will get help right away if you are not doing well or get worse. Document Released: 11/19/2005 Document Revised: 02/11/2012 Document Reviewed: 07/16/2011 [facility] Patient Information 2015 [place], [place]. This information is not intended to replace advice given to you by your health care provider. Make sure you  discuss any questions you have with your health care provider. Muscle Cramps and Spasms Muscle cramps and spasms occur when a muscle or muscles tighten and you have no control over this tightening (involuntary muscle contraction). They are a common problem and can develop in any muscle. The most common place is in the calf muscles of the leg. Both muscle cramps and muscle spasms are involuntary muscle contractions, but they also have differences:   Muscle cramps are sporadic and  painful. They may last a few seconds to a quarter of an hour. Muscle cramps are often more forceful and last longer than muscle spasms.  Muscle spasms may or may not be painful. They may also last just a few seconds or much longer. CAUSES  It is uncommon for cramps or spasms to be due to a serious underlying problem. In many cases, the cause of cramps or spasms is unknown. Some common causes are:   Overexertion.   Overuse from repetitive motions (doing the same thing over and over).   Remaining in a certain position for a long period of time.   Improper preparation, form, or technique while performing a sport or activity.   Dehydration.   Injury.   Side effects of some medicines.   Abnormally low levels of the salts and ions in your blood (electrolytes), especially potassium and calcium. This could happen if you are taking water pills (diuretics) or you are pregnant.  Some underlying medical problems can make it more likely to develop cramps or spasms. These include, but are not limited to:   Diabetes.   Parkinson disease.   Hormone disorders, such as thyroid problems.   Alcohol abuse.   Diseases specific to muscles, joints, and bones.   Blood vessel disease where not enough blood is getting to the muscles.  HOME CARE INSTRUCTIONS   Stay well hydrated. Drink enough water and fluids to keep your urine clear or pale yellow.  It may be helpful to massage, stretch, and relax the affected muscle.  For tight or tense muscles, use a warm towel, heating pad, or hot shower water directed to the affected area.  If you are sore or have pain after a cramp or spasm, applying ice to the affected area may relieve discomfort.  Put ice in a plastic bag.  Place a towel between your skin and the bag.  Leave the ice on for 15-20 minutes, 03-04 times a day.  Medicines used to treat a known cause of cramps or spasms may help reduce their frequency or severity. Only take  over-the-counter or prescription medicines as directed by your caregiver. SEEK MEDICAL CARE IF:  Your cramps or spasms get more severe, more frequent, or do not improve over time.  MAKE SURE YOU:   Understand these instructions.  Will watch your condition.  Will get help right away if you are not doing well or get worse. Document Released: 05/11/2002 Document Revised: 03/16/2013 Document Reviewed: 11/05/2012 [facility] Patient Information 2015 [place], [place]. This information is not intended to replace advice given to you by your health care provider. Make sure you discuss any questions you have with your health care provider.

## 2014-11-10 NOTE — ED Notes (Signed)
Pt c/o left shoulder and side pain, pt states she may have pulled a muscle. Hurts with deep breath.

## 2014-11-10 NOTE — ED Provider Notes (Signed)
CSN: [id number]     Arrival date & time 11/10/14  1851 History   None    Chief Complaint  Patient presents with  . Shoulder Pain  . side pain    The history is provided by the patient. No language interpreter was used.   This chart was scribed for non-physician practitioner Charlann Lange, PA-C,  working with Charlesetta Shanks, MD, by Thea Alken, ED Scribe. This patient was seen in room WTR8/WTR8 and the patient's care was started at 8:32 PM.  Sarah Greene is a 28 y.o. female who presents to the Emergency Department complaining of constant, pulsating, grabbing left side pain.that began this morning. Pt denies recent injuries and falls. She reports worsening pain with movement such as twisting and bending forward as well as breathing and touching area. She reports once she finds a comfortable position she is able to breath comfortably. She states she is also beginning to have neck pain and left shoulder pain that she reports is not as bad as side pain.  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 She denies fever, chills, SOB and cough. Pt is a smoker. She denies hx of bloodclots. She denies being on birth control. Pt is healthy is otherwise.   Past Medical History  Diagnosis Date  . Migraines    History reviewed. No pertinent past surgical history. Family History  Problem Relation Age of Onset  . Hypertension Other   . Diabetes Other    History  Substance Use Topics  . Smoking status: Current Every Day Smoker -- 0.20 packs/day    Types: Cigarettes  . Smokeless tobacco: Not on file  . Alcohol Use: Yes     Comment: occ   OB History    No data available     Review of Systems  Constitutional: Negative for fever and chills.  Respiratory: Negative for cough and shortness of breath.   Musculoskeletal: Positive for myalgias and arthralgias.   Allergies  Review of patient's allergies indicates no known allergies.  Home Medications   Prior to Admission medications    Medication Sig Start Date End Date Taking? Authorizing Provider  cephALEXin (KEFLEX) 500 MG capsule Take 1 capsule (500 mg total) by mouth 2 (two) times daily. 09/18/13   Nicole Pisciotta, PA-C  doxycycline (VIBRAMYCIN) 100 MG capsule Take 1 capsule (100 mg total) by mouth 2 (two) times daily. 09/18/13   Nicole Pisciotta, PA-C  ibuprofen (ADVIL,MOTRIN) 200 MG tablet Take 800 mg by mouth every 6 (six) hours as needed for pain. For cramps    Historical Provider, MD  metroNIDAZOLE (FLAGYL) 500 MG tablet Take 1 tablet (500 mg total) by mouth 3 (three) times daily. 09/18/13   Nicole Pisciotta, PA-C  oxyCODONE-acetaminophen (PERCOCET/ROXICET) 5-325 MG per tablet 1 to 2 tabs PO q6hrs  PRN for pain 09/18/13   Elmyra Ricks Pisciotta, PA-C  promethazine (PHENERGAN) 25 MG tablet Take 1 tablet (25 mg total) by mouth every 6 (six) hours as needed for nausea. 09/18/13   Nicole Pisciotta, PA-C   BP 114/72 mmHg  Pulse 98  Temp(Src) 98.2 F (36.8 C) (Oral)  Resp 18  SpO2 100%  LMP 10/21/2014 Physical Exam  Constitutional: She is oriented to person, place, and time. She appears well-developed and well-nourished. No distress.  HENT:  Head: Normocephalic and atraumatic.  Eyes: Conjunctivae and EOM are normal.  Neck: Neck supple.  Cardiovascular: Normal rate.   Pulmonary/Chest: Effort normal.  Musculoskeletal: Normal range of motion.  Left lateral and posterior  chest wall tender TP. Lungs are clear. Full ROM UE with full strength. No abdominal tenderness not CVA tenderbess  Neurological: She is alert and oriented to person, place, and time.  Skin: Skin is warm and dry.  Psychiatric: She has a normal mood and affect. Her behavior is normal.  Nursing note and vitals reviewed.   ED Course  Procedures (including critical care time) DIAGNOSTIC STUDIES: Oxygen Saturation is 100% on RA, normal by my interpretation.    COORDINATION OF CARE: 8:52 PM- Pt advised of plan for treatment and pt agrees.  Labs  Review Labs Reviewed - No data to display  Imaging Review No results found.   EKG Interpretation None      MDM   Final diagnoses:  None    1. Chest wall pain  Chest wall is TTP, better with rest, worse with movement. Suspect muscular chest wall pain. No SOB, tachycardia, PE risk factors. Will treat with pain control, muscle relaxers. Return precautions discussed.   I personally performed the services described in this documentation, which was scribed in my presence. The recorded information has been reviewed and is accurate.    Dewaine Oats, PA-C 11/11/14 0111  Charlesetta Shanks, MD 11/11/14 [phone]

## 2015-02-09 ENCOUNTER — Emergency Department (HOSPITAL_COMMUNITY)
Admission: EM | Admit: 2015-02-09 | Discharge: 2015-02-09 | Disposition: A | Discharge status: Home / Self Care | Payer: Self-pay | Attending: Emergency Medicine | Admitting: Emergency Medicine

## 2015-02-09 ENCOUNTER — Encounter (HOSPITAL_COMMUNITY): Payer: Self-pay | Admitting: Emergency Medicine

## 2015-02-09 ENCOUNTER — Emergency Department (HOSPITAL_COMMUNITY): Payer: Self-pay

## 2015-02-09 DIAGNOSIS — Z791 Long term (current) use of non-steroidal anti-inflammatories (NSAID): Secondary | ICD-10-CM | POA: Insufficient documentation

## 2015-02-09 DIAGNOSIS — Z79899 Other long term (current) drug therapy: Secondary | ICD-10-CM | POA: Insufficient documentation

## 2015-02-09 DIAGNOSIS — M25552 Pain in left hip: Secondary | ICD-10-CM

## 2015-02-09 DIAGNOSIS — M79631 Pain in right forearm: Secondary | ICD-10-CM

## 2015-02-09 DIAGNOSIS — Y9389 Activity, other specified: Secondary | ICD-10-CM | POA: Insufficient documentation

## 2015-02-09 DIAGNOSIS — S59911A Unspecified injury of right forearm, initial encounter: Secondary | ICD-10-CM | POA: Insufficient documentation

## 2015-02-09 DIAGNOSIS — Z720 Tobacco use: Secondary | ICD-10-CM | POA: Insufficient documentation

## 2015-02-09 DIAGNOSIS — G43909 Migraine, unspecified, not intractable, without status migrainosus: Secondary | ICD-10-CM | POA: Insufficient documentation

## 2015-02-09 DIAGNOSIS — M25512 Pain in left shoulder: Secondary | ICD-10-CM

## 2015-02-09 DIAGNOSIS — S79912A Unspecified injury of left hip, initial encounter: Secondary | ICD-10-CM | POA: Insufficient documentation

## 2015-02-09 DIAGNOSIS — Y998 Other external cause status: Secondary | ICD-10-CM | POA: Insufficient documentation

## 2015-02-09 DIAGNOSIS — S4992XA Unspecified injury of left shoulder and upper arm, initial encounter: Secondary | ICD-10-CM | POA: Insufficient documentation

## 2015-02-09 DIAGNOSIS — Y92410 Unspecified street and highway as the place of occurrence of the external cause: Secondary | ICD-10-CM | POA: Insufficient documentation

## 2015-02-09 DIAGNOSIS — Z792 Long term (current) use of antibiotics: Secondary | ICD-10-CM | POA: Insufficient documentation

## 2015-02-09 MED ORDER — METHOCARBAMOL 500 MG PO TABS: 500.0000 mg | ORAL_TABLET | Freq: Three times a day (TID) | ORAL | Status: DC | PRN

## 2015-02-09 MED ORDER — KETOROLAC TROMETHAMINE 60 MG/2ML IM SOLN
60.0000 mg | Freq: Once | INTRAMUSCULAR | Status: AC
  Administered 2015-02-09: 60 mg via INTRAMUSCULAR
  Filled 2015-02-09: qty 2

## 2015-02-09 MED ORDER — HYDROCODONE-ACETAMINOPHEN 5-325 MG PO TABS: 2.0000 | ORAL_TABLET | ORAL | Status: DC | PRN
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
## 2015-02-09 NOTE — Discharge Instructions (Signed)
Please rest use ice, ibuprofen, muscle relaxants, and hydrocodone as needed for pain. Please follow-up with PCP in one week for check-up or sooner if new or worsening symptoms present.

## 2015-10-12 ENCOUNTER — Ambulatory Visit (INDEPENDENT_AMBULATORY_CARE_PROVIDER_SITE_OTHER): Payer: BLUE CROSS/BLUE SHIELD | Admitting: Family Medicine

## 2015-10-12 ENCOUNTER — Encounter: Payer: Self-pay | Admitting: Family Medicine

## 2015-10-12 VITALS — BP 120/80 | HR 68 | Ht 69.5 in | Wt 131.2 lb

## 2015-10-12 DIAGNOSIS — Z7189 Other specified counseling: Secondary | ICD-10-CM

## 2015-10-12 DIAGNOSIS — F17200 Nicotine dependence, unspecified, uncomplicated: Secondary | ICD-10-CM

## 2015-10-12 DIAGNOSIS — R42 Dizziness and giddiness: Secondary | ICD-10-CM

## 2015-10-12 DIAGNOSIS — Z7689 Persons encountering health services in other specified circumstances: Secondary | ICD-10-CM

## 2015-10-12 DIAGNOSIS — Z720 Tobacco use: Secondary | ICD-10-CM | POA: Diagnosis not present

## 2015-10-12 DIAGNOSIS — Z23 Encounter for immunization: Secondary | ICD-10-CM

## 2015-10-12 MED ORDER — MECLIZINE HCL 25 MG PO TABS: 25.0000 mg | ORAL_TABLET | Freq: Two times a day (BID) | ORAL | Status: DC | PRN

## 2015-10-12 NOTE — Patient Instructions (Signed)
Stay hydrated and try the medication twice daily for 1 week then use as needed for dizziness. Take the Zofran you have at home as needed for nausea. Keep a journal of when the dizzy spells occur. Make sure you are changing positions slowly especially when getting up in the morning. Follow-up in 3-4 weeks and let me know how you are doing or sooner if you get worse.  Vertigo Vertigo means you feel like you or your surroundings are moving when they are not. Vertigo can be dangerous if it occurs when you are at work, driving, or performing difficult activities.  CAUSES  Vertigo occurs when there is a conflict of signals sent to your brain from the visual and sensory systems in your body. There are many different causes of vertigo, including:  Infections, especially in the inner ear.  A bad reaction to a drug or misuse of alcohol and medicines.  Withdrawal from drugs or alcohol.  Rapidly changing positions, such as lying down or rolling over in bed.  A migraine headache.  Decreased blood flow to the brain.  Increased pressure in the brain from a head injury, infection, tumor, or bleeding. SYMPTOMS  You may feel as though the world is spinning around or you are falling to the ground. Because your balance is upset, vertigo can cause nausea and vomiting. You may have involuntary eye movements (nystagmus). DIAGNOSIS  Vertigo is usually diagnosed by physical exam. If the cause of your vertigo is unknown, your caregiver may perform imaging tests, such as an MRI scan (magnetic resonance imaging). TREATMENT  Most cases of vertigo resolve on their own, without treatment. Depending on the cause, your caregiver may prescribe certain medicines. If your vertigo is related to body position issues, your caregiver may recommend movements or procedures to correct the problem. In rare cases, if your vertigo is caused by certain inner ear problems, you may need surgery. HOME CARE INSTRUCTIONS   Follow your  caregiver's instructions.  Avoid driving.  Avoid operating heavy machinery.  Avoid performing any tasks that would be dangerous to you or others during a vertigo episode.  Tell your caregiver if you notice that certain medicines seem to be causing your vertigo. Some of the medicines used to treat vertigo episodes can actually make them worse in some people. SEEK IMMEDIATE MEDICAL CARE IF:   Your medicines do not relieve your vertigo or are making it worse.  You develop problems with talking, walking, weakness, or using your arms, hands, or legs.  You develop severe headaches.  Your nausea or vomiting continues or gets worse.  You develop visual changes.  A family member notices behavioral changes.  Your condition gets worse. MAKE SURE YOU:  Understand these instructions.  Will watch your condition.  Will get help right away if you are not doing well or get worse.   This information is not intended to replace advice given to you by your health care provider. Make sure you discuss any questions you have with your health care provider.   Document Released: 08/29/2005 Document Revised: 02/11/2012 Document Reviewed: 03/14/2015 Elsevier Interactive Patient Education [employer].

## 2015-10-12 NOTE — Progress Notes (Signed)
Subjective:    Patient ID: Sarah Greene, female    DOB: May 12, 1986, 29 y.o.   MRN: [id number]  HPI Chief Complaint  Patient presents with  . new pt    new pt, dizziness, nasuea, hot and cold. neck and back stiff and hurting and went to urgent care ([facility] [address]) adn labs and everything was fine. no dizziness now as it comes and goes   She is new to the practice and here to establish primary care. States she has not had primary care in the past and usually goes to an urgent care or emergency department as needed Complains of intermittent dizziness for past 3 months when standing up or sitting down, occasionally she also has nausea. Describes dizziness as "spinning sensation". Reports this happens about 2 times per week. Has lasted for 1 or 2 days and even as long 4 days. She went to Urgent care when this happened last week and had normal lab results per patient.   States she gets yeast infections often and has not had one since last year.  Has seen chiropractor in past for chronic cervical and low back pain since 2009. Has not been in 5 years. She used to work at [employer] and did a lot of heavy lifting.  Smokes 3 cigarettes daily and drinks 1 beer daily Works at [employer] in cold temperatures.   Eats snacks throughout day and cooks dinner. Only drinks 2 12 ounces of water per drink. Drinks juice and coffee.  Lives alone. Has same sex partner.   Reviewed allergies, medications, past medical, surgical, family and social history.  Review of Systems Pertinent positives and negatives in the history of present illness.    Objective:   Physical Exam  Constitutional: She is oriented to person, place, and time. She appears well-developed and well-nourished. No distress.  HENT:  Right Ear: Tympanic membrane and ear canal normal.  Left Ear: Tympanic membrane and ear canal normal.  Nose: Nose normal.  Mouth/Throat: Uvula is midline, oropharynx is clear and moist and  mucous membranes are normal. No oropharyngeal exudate, posterior oropharyngeal edema or posterior oropharyngeal erythema.  Eyes: Conjunctivae and EOM are normal. Pupils are equal, round, and reactive to light.  Neck: Normal range of motion and full passive range of motion without pain. Neck supple.  Cardiovascular: Normal rate, regular rhythm, normal heart sounds and normal pulses.   Pulmonary/Chest: Effort normal and breath sounds normal.  Neurological: She is alert and oriented to person, place, and time. She has normal strength and normal reflexes. No cranial nerve deficit or sensory deficit. She displays a negative Romberg sign. Gait normal.  No ataxia, normal finger to nose  Skin: Skin is warm and dry. No cyanosis. No pallor. Nails show no clubbing.  Psychiatric: She has a normal mood and affect. Her speech is normal and behavior is normal. Thought content normal.   BP 120/80 mmHg  Pulse 68  Ht 5' 9.5" (1.765 m)  Wt 131 lb 3.2 oz (59.512 kg)  BMI 19.10 kg/m2  Orthostatic vitals: she is not postural    Assessment & Plan:  Dizziness - Plan: meclizine (ANTIVERT) 25 MG tablet  Encounter to establish care  Smoker  Need for prophylactic vaccination and inoculation against influenza - Plan: Flu Vaccine QUAD 36+ mos IM  Reviewed urgent care lab results, which were normal, including TSH, with patient and discussed that her symptoms appear to be related to vertigo. Discussed that her neurological examination is normal  and that she is not postural. Recommend that she start drinking more water, up to 4 or 5 bottles per day and avoid skipping meals. Discussed that she should change positions slowly. She will take meclizine bid for one week and if this is making her drowsy she may cut the pill in half. She will then back off and use the medication as needed for next few weeks. Discussed that she should keep a diary of her symptoms and related events for a follow-up visit. Recommend that she let me  know if this is not helping with dizzy episodes and we will consider vestibular rehabilitation. She is currently smoking as a habit and expresses interest in stopping, will discuss smoking cessation at next visit

## 2015-10-14 ENCOUNTER — Ambulatory Visit: Payer: Self-pay | Admitting: Family Medicine

## 2015-10-18 ENCOUNTER — Encounter: Payer: Self-pay | Admitting: Family Medicine

## 2017-02-14 ENCOUNTER — Ambulatory Visit (HOSPITAL_COMMUNITY)
Admission: EM | Admit: 2017-02-14 | Discharge: 2017-02-14 | Disposition: A | Discharge status: Home / Self Care | Payer: 59

## 2017-02-14 ENCOUNTER — Encounter (HOSPITAL_COMMUNITY): Payer: Self-pay | Admitting: Emergency Medicine

## 2017-02-14 DIAGNOSIS — K0889 Other specified disorders of teeth and supporting structures: Secondary | ICD-10-CM | POA: Diagnosis not present

## 2017-02-14 DIAGNOSIS — K047 Periapical abscess without sinus: Secondary | ICD-10-CM | POA: Diagnosis not present

## 2017-02-14 MED ORDER — AMOXICILLIN 500 MG PO CAPS: 1000.0000 mg | ORAL_CAPSULE | Freq: Two times a day (BID) | ORAL | 0 refills | Status: DC

## 2017-02-14 MED ORDER — HYDROCODONE-ACETAMINOPHEN 5-325 MG PO TABS: 1.0000 | ORAL_TABLET | ORAL | 0 refills | Status: DC | PRN

## 2017-02-14 NOTE — ED Provider Notes (Signed)
CSN: [id number]     Arrival date & time 02/14/17  1703 History   First MD Initiated Contact with Patient 02/14/17 1804     Chief Complaint  Patient presents with  . Dental Pain   (Consider location/radiation/quality/duration/timing/severity/associated sxs/prior Treatment) 31 year old female complaining of a toothache with intraoral swelling of the buccal mucosa adjacent to the right upper third molar. Started this week. Also has a headache.      Past Medical History:  Diagnosis Date  . Chronic cervical pain   . Chronic lower back pain   . Migraines   . Pneumonia 2005   3 episodes   Past Surgical History:  Procedure Laterality Date  . WISDOM TOOTH EXTRACTION     Family History  Problem Relation Age of Onset  . Hypertension Mother   . Arthritis Mother   . Hypertension Other   . Diabetes Other   . Asthma Brother    Social History  Substance Use Topics  . Smoking status: Current Some Day Smoker    Packs/day: 0.20    Types: Cigarettes  . Smokeless tobacco: Never Used  . Alcohol use 0.6 oz/week    1 Cans of beer per week     Comment: 1 beer daily   OB History    No data available     Review of Systems  Constitutional: Negative for activity change and fever.  HENT: Positive for dental problem. Negative for ear pain, postnasal drip, rhinorrhea and trouble swallowing.   Eyes: Negative.   Respiratory: Negative.   All other systems reviewed and are negative.   Allergies  Patient has no known allergies.  Home Medications   Prior to Admission medications   Medication Sig Start Date End Date Taking? Authorizing Provider  ibuprofen (ADVIL,MOTRIN) 800 MG tablet Take 800 mg by mouth every 8 (eight) hours as needed.   Yes Historical Provider, MD  amoxicillin (AMOXIL) 500 MG capsule Take 2 capsules (1,000 mg total) by mouth 2 (two) times daily. 02/14/17   Janne Napoleon, NP  HYDROcodone-acetaminophen (NORCO/VICODIN) 5-325 MG tablet Take 1 tablet by mouth every 4 (four) hours  as needed. 02/14/17   Janne Napoleon, NP  meclizine (ANTIVERT) 25 MG tablet Take 1 tablet (25 mg total) by mouth 2 (two) times daily as needed for dizziness. 10/12/15   Vickie L Henson, NP-C  ondansetron (ZOFRAN-ODT) 4 MG disintegrating tablet Take 4 mg by mouth every 6 (six) hours as needed. for nausea 10/05/15   Historical Provider, MD   Meds Ordered and Administered this Visit  Medications - No data to display  BP 122/77 (BP Location: Right Arm)   Pulse 76   Temp 98.5 F (36.9 C) (Oral)   SpO2 100%  No data found.   Physical Exam  Constitutional: She is oriented to person, place, and time. She appears well-developed and well-nourished. No distress.  HENT:  Mouth/Throat: Oropharynx is clear and moist. No oropharyngeal exudate.  Right upper third molar with dental tenderness. Adjacent buccal mucosa with minor swelling. No other lesions seen. Airway widely patent.  Neck: Normal range of motion. Neck supple.  Cardiovascular: Normal rate.   Pulmonary/Chest: Effort normal.  Neurological: She is alert and oriented to person, place, and time.  Nursing note and vitals reviewed.   Urgent Care Course     Procedures (including critical care time)  Labs Review Labs Reviewed - No data to display  Imaging Review No results found.   Visual Acuity Review  Right Eye Distance:   Left Eye  Distance:   Bilateral Distance:    Right Eye Near:   Left Eye Near:    Bilateral Near:         MDM   1. Pain, dental   2. Dental infection    Take the medication as directed. May take ibuprofen with this. See your dentist as soon as possible. Meds ordered this encounter  Medications  . ibuprofen (ADVIL,MOTRIN) 800 MG tablet    Sig: Take 800 mg by mouth every 8 (eight) hours as needed.  Marland Kitchen HYDROcodone-acetaminophen (NORCO/VICODIN) 5-325 MG tablet    Sig: Take 1 tablet by mouth every 4 (four) hours as needed.    Dispense:  15 tablet    Refill:  0    Order Specific Question:   Supervising  Provider    Answer:   Sherlene Shams [[id number]]  . amoxicillin (AMOXIL) 500 MG capsule    Sig: Take 2 capsules (1,000 mg total) by mouth 2 (two) times daily.    Dispense:  40 capsule    Refill:  0    Order Specific Question:   Supervising Provider    Answer:   Sherlene Shams [[id number]]       Janne Napoleon, NP 02/14/17 1827

## 2017-02-14 NOTE — ED Triage Notes (Signed)
Pt has been suffering from right upper dental pain for one week.  She also reports new onset throat pain today.

## 2017-02-14 NOTE — Discharge Instructions (Signed)
Take the medication as directed. May take ibuprofen with this. See your dentist as soon as possible.

## 2017-04-01 ENCOUNTER — Emergency Department (HOSPITAL_COMMUNITY): Payer: 59

## 2017-04-01 ENCOUNTER — Encounter (HOSPITAL_COMMUNITY): Payer: Self-pay | Admitting: Emergency Medicine

## 2017-04-01 ENCOUNTER — Emergency Department (HOSPITAL_COMMUNITY)
Admission: EM | Admit: 2017-04-01 | Discharge: 2017-04-01 | Disposition: A | Discharge status: Home / Self Care | Payer: 59 | Attending: Emergency Medicine | Admitting: Emergency Medicine

## 2017-04-01 DIAGNOSIS — F17210 Nicotine dependence, cigarettes, uncomplicated: Secondary | ICD-10-CM | POA: Insufficient documentation

## 2017-04-01 DIAGNOSIS — R112 Nausea with vomiting, unspecified: Secondary | ICD-10-CM | POA: Diagnosis not present

## 2017-04-01 DIAGNOSIS — R1032 Left lower quadrant pain: Secondary | ICD-10-CM

## 2017-04-01 DIAGNOSIS — D259 Leiomyoma of uterus, unspecified: Secondary | ICD-10-CM | POA: Insufficient documentation

## 2017-04-01 DIAGNOSIS — R109 Unspecified abdominal pain: Secondary | ICD-10-CM

## 2017-04-01 LAB — URINALYSIS, ROUTINE W REFLEX MICROSCOPIC
Bacteria, UA: NONE SEEN
Bilirubin Urine: NEGATIVE
GLUCOSE, UA: NEGATIVE mg/dL
KETONES UR: 5 mg/dL — AB
Leukocytes, UA: NEGATIVE
Nitrite: NEGATIVE
PH: 6 (ref 5.0–8.0)
Protein, ur: 30 mg/dL — AB
Specific Gravity, Urine: 1.02 (ref 1.005–1.030)

## 2017-04-01 LAB — COMPREHENSIVE METABOLIC PANEL
ALT: 14 U/L (ref 14–54)
ANION GAP: 7 (ref 5–15)
AST: 21 U/L (ref 15–41)
Albumin: 4 g/dL (ref 3.5–5.0)
Alkaline Phosphatase: 30 U/L — ABNORMAL LOW (ref 38–126)
BILIRUBIN TOTAL: 0.6 mg/dL (ref 0.3–1.2)
BUN: 11 mg/dL (ref 6–20)
CO2: 21 mmol/L — ABNORMAL LOW (ref 22–32)
Calcium: 8.4 mg/dL — ABNORMAL LOW (ref 8.9–10.3)
Chloride: 112 mmol/L — ABNORMAL HIGH (ref 101–111)
Creatinine, Ser: 0.68 mg/dL (ref 0.44–1.00)
GFR calc Af Amer: >60 mL/min (ref >60)
GFR calc non Af Amer: >60 mL/min (ref >60)
Glucose, Bld: 109 mg/dL — ABNORMAL HIGH (ref 65–99)
POTASSIUM: 3.9 mmol/L (ref 3.5–5.1)
Sodium: 140 mmol/L (ref 135–145)
TOTAL PROTEIN: 6.8 g/dL (ref 6.5–8.1)

## 2017-04-01 LAB — CBC WITH DIFFERENTIAL/PLATELET
BASOS PCT: 0 %
Basophils Absolute: 0 10*3/uL (ref 0.0–0.1)
Eosinophils Absolute: 0 10*3/uL (ref 0.0–0.7)
Eosinophils Relative: 0 %
HEMATOCRIT: 34.7 % — AB (ref 36.0–46.0)
Hemoglobin: 11.9 g/dL — ABNORMAL LOW (ref 12.0–15.0)
LYMPHS PCT: 6 %
Lymphs Abs: 0.9 10*3/uL (ref 0.7–4.0)
MCH: 30.7 pg (ref 26.0–34.0)
MCHC: 34.3 g/dL (ref 30.0–36.0)
MCV: 89.7 fL (ref 78.0–100.0)
MONOS PCT: 6 %
Monocytes Absolute: 0.8 10*3/uL (ref 0.1–1.0)
NEUTROS ABS: 12.2 10*3/uL — AB (ref 1.7–7.7)
Neutrophils Relative %: 88 %
Platelets: 146 10*3/uL — ABNORMAL LOW (ref 150–400)
RBC: 3.87 MIL/uL (ref 3.87–5.11)
RDW: 13.4 % (ref 11.5–15.5)
WBC: 13.8 10*3/uL — ABNORMAL HIGH (ref 4.0–10.5)

## 2017-04-01 LAB — I-STAT BETA HCG BLOOD, ED (MC, WL, AP ONLY): I-stat hCG, quantitative: <5 m[IU]/mL (ref <5)

## 2017-04-01 LAB — LIPASE, BLOOD: Lipase: 21 U/L (ref 11–51)

## 2017-04-01 MED ORDER — SODIUM CHLORIDE 0.9 % IV BOLUS (SEPSIS)
1000.0000 mL | Freq: Once | INTRAVENOUS | Status: AC
  Administered 2017-04-01: 1000 mL via INTRAVENOUS

## 2017-04-01 MED ORDER — PROMETHAZINE HCL 25 MG/ML IJ SOLN
12.5000 mg | Freq: Four times a day (QID) | INTRAMUSCULAR | Status: DC | PRN
  Administered 2017-04-01: 12.5 mg via INTRAVENOUS
  Filled 2017-04-01: qty 1

## 2017-04-01 MED ORDER — TRAMADOL HCL 50 MG PO TABS: 50.0000 mg | ORAL_TABLET | Freq: Four times a day (QID) | ORAL | 0 refills | Status: DC | PRN

## 2017-04-01 MED ORDER — HYDROMORPHONE HCL 1 MG/ML IJ SOLN
0.5000 mg | Freq: Once | INTRAMUSCULAR | Status: AC
  Administered 2017-04-01: 0.5 mg via INTRAVENOUS
  Filled 2017-04-01: qty 1

## 2017-04-01 MED ORDER — PROMETHAZINE HCL 25 MG PO TABS: 25.0000 mg | ORAL_TABLET | Freq: Four times a day (QID) | ORAL | 0 refills | Status: DC | PRN

## 2017-04-01 MED ORDER — IOPAMIDOL (ISOVUE-300) INJECTION 61%
INTRAVENOUS | Status: AC
  Administered 2017-04-01: 100 mL via INTRAVENOUS
  Filled 2017-04-01: qty 100

## 2017-04-01 NOTE — ED Notes (Signed)
Bed: WA04 Expected date:  Expected time:  Means of arrival: Ambulance Comments: EMS vaginal bleeding, n/v

## 2017-04-01 NOTE — ED Triage Notes (Addendum)
Per EMS pt c/o severe abdominal period cramping with emesis. Pt initial BP palp 88 systolic after 861UO NS BP 118/62. Pt given zofran en route and slept entire way. On arrival pt woke and began c/o worsening cramping. CBG 134

## 2017-04-01 NOTE — Discharge Instructions (Signed)
Continue ibuprofen or aleve for pain. Tramadol for severe pain only. Phenergan for nausea and vomiting. Follow up with OB/GYN for further evaluation. Return if worsening symptoms

## 2017-04-01 NOTE — ED Notes (Signed)
Pt unable to provide urine sample at this time 

## 2017-04-01 NOTE — ED Provider Notes (Signed)
[facility] DEPT Provider Note   CSN: [id number] Arrival date & time: 04/01/17  1356     History   Chief Complaint Chief Complaint  Patient presents with  . Abdominal Pain  . Emesis    HPI Sarah Greene is a 31 y.o. female.  HPI Sarah Greene is a 31 y.o. female presents to ED with complaint of nausea, vomiting, abdominal pain. Pt states pain started this morning. Pt currently on her menstrual cycle but states this pain feels different from her cramps. States pain is in the left lower abdomen, does not radiate. No urinary symptoms. No vaginal discharge. States vomited more than 10 times. Denies diarrhea. Pt currently on antibiotics for the last 2 weeks for dental abscess. She denies fever or chills. No blood in stool or emesis. Pt received zofran by EMS, states did not help. Also upon EMS arrival noted systolic BP of 88, given a bolus of IV fluids. Pt states similar pain happened few years ago.   Past Medical History:  Diagnosis Date  . Chronic cervical pain   . Chronic lower back pain   . Migraines   . Pneumonia 2005   3 episodes    There are no active problems to display for this patient.   Past Surgical History:  Procedure Laterality Date  . WISDOM TOOTH EXTRACTION      OB History    No data available       Home Medications    Prior to Admission medications   Medication Sig Start Date End Date Taking? Authorizing Provider  amoxicillin (AMOXIL) 500 MG capsule Take 2 capsules (1,000 mg total) by mouth 2 (two) times daily. 02/14/17  Yes Janne Napoleon, NP  HYDROcodone-acetaminophen (NORCO/VICODIN) 5-325 MG tablet Take 1 tablet by mouth every 4 (four) hours as needed. Patient not taking: Reported on 04/01/2017 02/14/17   Janne Napoleon, NP  meclizine (ANTIVERT) 25 MG tablet Take 1 tablet (25 mg total) by mouth 2 (two) times daily as needed for dizziness. Patient not taking: Reported on 04/01/2017 10/12/15   Girtha Rm, NP-C    Family History Family History  Problem  Relation Age of Onset  . Hypertension Mother   . Arthritis Mother   . Hypertension Other   . Diabetes Other   . Asthma Brother     Social History Social History  Substance Use Topics  . Smoking status: Current Some Day Smoker    Packs/day: 0.20    Types: Cigarettes  . Smokeless tobacco: Never Used  . Alcohol use 0.6 oz/week    1 Cans of beer per week     Comment: 1 beer daily     Allergies   Patient has no known allergies.   Review of Systems Review of Systems  Constitutional: Negative for chills and fever.  Respiratory: Negative for cough, chest tightness and shortness of breath.   Cardiovascular: Negative for chest pain, palpitations and leg swelling.  Gastrointestinal: Positive for abdominal pain, nausea and vomiting. Negative for diarrhea.  Genitourinary: Positive for pelvic pain and vaginal bleeding. Negative for dysuria, flank pain, vaginal discharge and vaginal pain.  Musculoskeletal: Negative for arthralgias, myalgias, neck pain and neck stiffness.  Skin: Negative for rash.  Neurological: Negative for dizziness, weakness and headaches.  All other systems reviewed and are negative.    Physical Exam Updated Vital Signs BP 121/83 (BP Location: Right Arm)   Pulse 75   Temp 98.3 F (36.8 C) (Axillary)   Resp 16   LMP 04/01/2017  SpO2 100%   Physical Exam  Constitutional: She is oriented to person, place, and time. She appears well-developed and well-nourished.  Actively vomiting.  HENT:  Head: Normocephalic.  Eyes: Conjunctivae are normal.  Neck: Neck supple.  Cardiovascular: Normal rate, regular rhythm and normal heart sounds.   Pulmonary/Chest: Effort normal and breath sounds normal. No respiratory distress. She has no wheezes. She has no rales.  Abdominal: Soft. Bowel sounds are normal. She exhibits no distension. There is tenderness. There is no rebound.  LLQ tenderness  Musculoskeletal: She exhibits no edema.  Neurological: She is alert and  oriented to person, place, and time.  Skin: Skin is warm and dry.  Psychiatric: She has a normal mood and affect. Her behavior is normal.  Nursing note and vitals reviewed.    ED Treatments / Results  Labs (all labs ordered are listed, but only abnormal results are displayed) Labs Reviewed  CBC WITH DIFFERENTIAL/PLATELET - Abnormal; Notable for the following:       Result Value   WBC 13.8 (*)    Hemoglobin 11.9 (*)    HCT 34.7 (*)    Platelets 146 (*)    Neutro Abs 12.2 (*)    All other components within normal limits  URINALYSIS, ROUTINE W REFLEX MICROSCOPIC - Abnormal; Notable for the following:    Hgb urine dipstick LARGE (*)    Ketones, ur 5 (*)    Protein, ur 30 (*)    Squamous Epithelial / LPF 0-5 (*)    All other components within normal limits  COMPREHENSIVE METABOLIC PANEL - Abnormal; Notable for the following:    Chloride 112 (*)    CO2 21 (*)    Glucose, Bld 109 (*)    Calcium 8.4 (*)    Alkaline Phosphatase 30 (*)    All other components within normal limits  LIPASE, BLOOD  I-STAT BETA HCG BLOOD, ED ([facility], [facility], [facility] ONLY)    EKG  EKG Interpretation None       Radiology No results found.  Procedures Procedures (including critical care time)  Medications Ordered in ED Medications  HYDROmorphone (DILAUDID) injection 0.5 mg (not administered)  promethazine (PHENERGAN) injection 12.5 mg (not administered)  sodium chloride 0.9 % bolus 1,000 mL (not administered)     Initial Impression / Assessment and Plan / ED Course  I have reviewed the triage vital signs and the nursing notes.  Pertinent labs & imaging results that were available during my care of the patient were reviewed by me and considered in my medical decision making (see chart for details).     Pt seen and examined. Pt with left lower abd pain acute onset this morning. Associate nausea and vomiting. No diarrhea. Will check labs, get [place] ro torsion. Pt refusing pelvic exam at this time. States  has no discharge and not sexually acitve   4:34 PM Nausea improved. Patient continues to have pain. Ultrasound showing fibroid uterus, otherwise unremarkable. Patient continues to have pain in the left lower quadrant. Will get CT abdomen and pelvis to rule out colitis, especially because patient has been on antibiotics for the last 2 weeks. She does have leukocytosis, white blood count of 13.8.  6:13 PM CT findings as described above. Patient does not have any pain or tenderness in the right upper quadrant. I do not think she has cholecystitis or hepatitis, however these results were discussed with her and she is aware. Her pain has improved and she denies any pain or nausea at this  time. She would like to be discharged home. Urinalysis pending. Question ruptured ovarian cyst as the cause of her pain. Will give prescription for pain and nausea medication for few days if pain returns. Strict return precautions discussed.   Vitals:   04/01/17 1408 04/01/17 1638 04/01/17 1903  BP: 121/83 119/79 120/80  Pulse: 75 71 70  Resp: 16 12 18   Temp: 98.3 F (36.8 C) 98.4 F (36.9 C)   TempSrc: Axillary Oral   SpO2: 100% 100% 98%     Final Clinical Impressions(s) / ED Diagnoses   Final diagnoses:  Abdominal pain  Left lower quadrant pain  Nausea and vomiting, intractability of vomiting not specified, unspecified vomiting type  Uterine leiomyoma, unspecified location    New Prescriptions Discharge Medication List as of 04/01/2017  6:52 PM    START taking these medications   Details  promethazine (PHENERGAN) 25 MG tablet Take 1 tablet (25 mg total) by mouth every 6 (six) hours as needed for nausea or vomiting., Starting Mon 04/01/2017, Print    traMADol (ULTRAM) 50 MG tablet Take 1 tablet (50 mg total) by mouth every 6 (six) hours as needed., Starting Mon 04/01/2017, Print         Celestino Ackerman, PA-C 04/01/17 [place], MD 04/02/17 [phone]

## 2017-04-01 NOTE — ED Notes (Signed)
US in process

## 2017-10-04 ENCOUNTER — Emergency Department (HOSPITAL_COMMUNITY): Payer: 59

## 2017-10-04 ENCOUNTER — Emergency Department (HOSPITAL_COMMUNITY)
Admission: EM | Admit: 2017-10-04 | Discharge: 2017-10-04 | Disposition: A | Discharge status: Home / Self Care | Payer: 59 | Attending: Emergency Medicine | Admitting: Emergency Medicine

## 2017-10-04 ENCOUNTER — Encounter (HOSPITAL_COMMUNITY): Payer: Self-pay | Admitting: Emergency Medicine

## 2017-10-04 DIAGNOSIS — N069 Isolated proteinuria with unspecified morphologic lesion: Secondary | ICD-10-CM

## 2017-10-04 DIAGNOSIS — R102 Pelvic and perineal pain: Secondary | ICD-10-CM | POA: Diagnosis not present

## 2017-10-04 DIAGNOSIS — F17210 Nicotine dependence, cigarettes, uncomplicated: Secondary | ICD-10-CM | POA: Diagnosis not present

## 2017-10-04 DIAGNOSIS — R1030 Lower abdominal pain, unspecified: Secondary | ICD-10-CM

## 2017-10-04 DIAGNOSIS — N946 Dysmenorrhea, unspecified: Secondary | ICD-10-CM | POA: Insufficient documentation

## 2017-10-04 DIAGNOSIS — R109 Unspecified abdominal pain: Secondary | ICD-10-CM | POA: Diagnosis present

## 2017-10-04 LAB — URINALYSIS, ROUTINE W REFLEX MICROSCOPIC
BILIRUBIN URINE: NEGATIVE
Glucose, UA: NEGATIVE mg/dL
KETONES UR: NEGATIVE mg/dL
LEUKOCYTES UA: NEGATIVE
NITRITE: NEGATIVE
PROTEIN: 100 mg/dL — AB
SPECIFIC GRAVITY, URINE: 1.029 (ref 1.005–1.030)
pH: 6 (ref 5.0–8.0)

## 2017-10-04 LAB — COMPREHENSIVE METABOLIC PANEL
ALT: 16 U/L (ref 14–54)
AST: 19 U/L (ref 15–41)
Albumin: 4 g/dL (ref 3.5–5.0)
Alkaline Phosphatase: 33 U/L — ABNORMAL LOW (ref 38–126)
Anion gap: 8 (ref 5–15)
BUN: 15 mg/dL (ref 6–20)
CHLORIDE: 106 mmol/L (ref 101–111)
CO2: 23 mmol/L (ref 22–32)
Calcium: 8.7 mg/dL — ABNORMAL LOW (ref 8.9–10.3)
Creatinine, Ser: 0.89 mg/dL (ref 0.44–1.00)
GFR calc Af Amer: >60 mL/min (ref >60)
Glucose, Bld: 89 mg/dL (ref 65–99)
POTASSIUM: 4.2 mmol/L (ref 3.5–5.1)
Sodium: 137 mmol/L (ref 135–145)
TOTAL PROTEIN: 6.8 g/dL (ref 6.5–8.1)
Total Bilirubin: 0.8 mg/dL (ref 0.3–1.2)

## 2017-10-04 LAB — I-STAT BETA HCG BLOOD, ED (MC, WL, AP ONLY)

## 2017-10-04 LAB — LIPASE, BLOOD: LIPASE: 22 U/L (ref 11–51)

## 2017-10-04 LAB — CBC
HEMATOCRIT: 38.7 % (ref 36.0–46.0)
HEMOGLOBIN: 12.8 g/dL (ref 12.0–15.0)
MCH: 30.3 pg (ref 26.0–34.0)
MCHC: 33.1 g/dL (ref 30.0–36.0)
MCV: 91.5 fL (ref 78.0–100.0)
Platelets: 144 10*3/uL — ABNORMAL LOW (ref 150–400)
RBC: 4.23 MIL/uL (ref 3.87–5.11)
RDW: 13 % (ref 11.5–15.5)
WBC: 6.9 10*3/uL (ref 4.0–10.5)

## 2017-10-04 MED ORDER — ONDANSETRON 4 MG PO TBDP
4.0000 mg | ORAL_TABLET | Freq: Once | ORAL | Status: AC
  Administered 2017-10-04: 4 mg via ORAL
  Filled 2017-10-04: qty 1

## 2017-10-04 MED ORDER — MORPHINE SULFATE (PF) 4 MG/ML IV SOLN
4.0000 mg | Freq: Once | INTRAVENOUS | Status: AC
  Administered 2017-10-04: 4 mg via INTRAVENOUS
  Filled 2017-10-04: qty 1

## 2017-10-04 MED ORDER — NAPROXEN 500 MG PO TABS: 500.0000 mg | ORAL_TABLET | Freq: Two times a day (BID) | ORAL | 0 refills | Status: DC

## 2017-10-04 MED ORDER — KETOROLAC TROMETHAMINE 30 MG/ML IJ SOLN
30.0000 mg | Freq: Once | INTRAMUSCULAR | Status: AC
  Administered 2017-10-04: 30 mg via INTRAVENOUS
  Filled 2017-10-04: qty 1

## 2017-10-04 NOTE — Discharge Instructions (Signed)
Please see the information and instructions below regarding your visit.  Your diagnoses today include:  1. Lower abdominal pain   2. Menstrual pain   3. Isolated proteinuria with morphologic lesion    Your exam and ultrasound was reassuring today.  There is no twisting of the ovaries or cyst on the ovaries.  The fibroids in her uterus have grown in size since they were last seen on ultrasound.  We would like you to follow-up with an OB/GYN regarding this finding.  Additionally, it was noted that there is protein in her urine.  I would like a urinalysis repeated when you follow-up with a primary care provider.  Tests performed today include: See side panel of your discharge paperwork for testing performed today. Vital signs are listed at the bottom of these instructions.   Pelvic ultrasound Urine Electrolytes, kidney function, liver function  Medications prescribed:    Take any prescribed medications only as prescribed, and any over the counter medications only as directed on the packaging.  You are prescribed Naproxen, a non-steroidal anti-inflammatory agent (NSAID) for pain. You may take 500mg  every 12 hours as needed for pain. If still requiring this medication around the clock for acute pain after 10 days, please see your primary healthcare provider.  You may combine this medication with Tylenol, 650 mg every 6 hours, so you are receiving something for pain every 3 hours.  This is not a long-term medication unless under the care and direction of your primary provider. Taking this medication long-term and not under the supervision of a healthcare provider could increase the risk of stomach ulcers, kidney problems, and cardiovascular problems such as high blood pressure.    Home care instructions:  Please follow any educational materials contained in this packet.   Please apply warm compresses to the lower abdomen.  Follow-up instructions: Please follow-up with your primary care  provider in as soon as possible for further evaluation of your symptoms if they are not completely improved.   Please follow up with OBGYN in as soon as possible for further eval.  Return instructions:  Please return to the Emergency Department if you experience worsening symptoms.  Please return the emergency department for any focal abdominal tenderness, severe bleeding or you are soaking through at least a pad an hour for 6 hours, developing fever or chills with these symptoms, nausea or vomiting where he cannot keep anything down. Please return if you have any other emergent concerns.  Additional Information:   Your vital signs today were: BP 109/78    Pulse 66    Temp 98 F (36.7 C) (Oral)    Resp 18    Ht 5\' 9"  (1.753 m)    Wt 56.7 kg (125 lb)    LMP 10/04/2017    SpO2 99%    BMI 18.46 kg/m  If your blood pressure (BP) was elevated on multiple readings during this visit above 130 for the top number or above 80 for the bottom number, please have this repeated by your primary care provider within one month. --------------  Thank you for allowing [place] to participate in your care today.

## 2017-10-04 NOTE — ED Notes (Signed)
US at bedside

## 2017-10-04 NOTE — ED Provider Notes (Signed)
Sarah Greene Provider Note   CSN: [id number] Arrival date & time: 10/04/17  0447     History   Chief Complaint Chief Complaint  Patient presents with  . Abdominal Pain    HPI Sarah Greene is a 31 y.o. female.  HPI   Patient is a 31 year old female with a history of painful menses presenting for lower abdominal cramping in the setting of the first day of her menses.  Patient reports that this intimately happens every couple months with her periods and feels exactly like prior pain.  Patient has no abdominal surgical history.  Patient reports that the pain radiates into her pelvis and back, and thighs.  Patient reports that pain is 10 out of 10.  Patient reports she vomited today.  Patient reports some soft stools, but no diarrhea. Patient reports no fever or chills.  Patient was evaluated in April 2018 for the symptoms, and transvaginal ultrasounds performed at this time, and fibroids were identified.  Patient does not have an OB/GYN.  Patient has not seen a primary care provider for this. Patient reports she does not have intercourse of any kind, and has never had intercourse.   Past Medical History:  Diagnosis Date  . Chronic cervical pain   . Chronic lower back pain   . Migraines   . Pneumonia 2005   3 episodes    There are no active problems to display for this patient.   Past Surgical History:  Procedure Laterality Date  . WISDOM TOOTH EXTRACTION      OB History    No data available       Home Medications    Prior to Admission medications   Medication Sig Start Date End Date Taking? Authorizing Provider  ibuprofen (ADVIL,MOTRIN) 200 MG tablet Take 800 mg by mouth every 6 (six) hours as needed for moderate pain.   Yes [provider]  amoxicillin (AMOXIL) 500 MG capsule Take 2 capsules (1,000 mg total) by mouth 2 (two) times daily. Patient not taking: Reported on 10/04/2017 02/14/17   Janne Napoleon, NP    HYDROcodone-acetaminophen (NORCO/VICODIN) 5-325 MG tablet Take 1 tablet by mouth every 4 (four) hours as needed. Patient not taking: Reported on 04/01/2017 02/14/17   Janne Napoleon, NP  meclizine (ANTIVERT) 25 MG tablet Take 1 tablet (25 mg total) by mouth 2 (two) times daily as needed for dizziness. Patient not taking: Reported on 04/01/2017 10/12/15   Harland Dingwall L, NP-C  naproxen (NAPROSYN) 500 MG tablet Take 1 tablet (500 mg total) by mouth 2 (two) times daily. 10/04/17   Langston Masker B, PA-C  promethazine (PHENERGAN) 25 MG tablet Take 1 tablet (25 mg total) by mouth every 6 (six) hours as needed for nausea or vomiting. Patient not taking: Reported on 10/04/2017 04/01/17   Jeannett Senior, PA-C  traMADol (ULTRAM) 50 MG tablet Take 1 tablet (50 mg total) by mouth every 6 (six) hours as needed. Patient not taking: Reported on 10/04/2017 04/01/17   Jeannett Senior, PA-C    Family History Family History  Problem Relation Age of Onset  . Hypertension Mother   . Arthritis Mother   . Hypertension Other   . Diabetes Other   . Asthma Brother     Social History Social History  Substance Use Topics  . Smoking status: Current Some Day Smoker    Packs/day: 0.20    Types: Cigarettes  . Smokeless tobacco: Never Used  . Alcohol use 0.6 oz/week    1  Cans of beer per week     Comment: 1 beer daily     Allergies   Patient has no known allergies.   Review of Systems Review of Systems  Constitutional: Negative for chills and fever.  HENT: Negative for congestion, rhinorrhea and sore throat.   Eyes: Negative for visual disturbance.  Respiratory: Negative for cough and shortness of breath.   Cardiovascular: Negative for chest pain.  Gastrointestinal: Positive for abdominal pain, nausea and vomiting. Negative for diarrhea.  Genitourinary: Positive for pelvic pain. Negative for dysuria and flank pain.  Musculoskeletal: Negative for back pain and myalgias.  Skin: Negative for rash.   Neurological: Positive for dizziness. Negative for syncope, light-headedness and headaches.     Physical Exam Updated Vital Signs BP 109/78   Pulse 66   Temp 98 F (36.7 C) (Oral)   Resp 18   Ht 5\' 9"  (1.753 m)   Wt 56.7 kg (125 lb)   LMP 10/04/2017   SpO2 99%   BMI 18.46 kg/m   Physical Exam  Constitutional: She appears well-developed and well-nourished. No distress.  HENT:  Head: Normocephalic and atraumatic.  Mouth/Throat: Oropharynx is clear and moist.  Eyes: Pupils are equal, round, and reactive to light. Conjunctivae and EOM are normal.  Neck: Normal range of motion. Neck supple.  Cardiovascular: Normal rate, regular rhythm, S1 normal and S2 normal.   No murmur heard. Pulmonary/Chest: Effort normal and breath sounds normal. She has no wheezes. She has no rales.  Abdominal: Soft. She exhibits no distension. There is tenderness. There is no guarding.  Tender to palpation across entire lower abdomen.  Pain most focal in left lower quadrant.  Musculoskeletal: Normal range of motion. She exhibits no edema or deformity.  Lymphadenopathy:    She has no cervical adenopathy.  Neurological: She is alert.  Cranial nerves grossly intact. Patient was extremities with good coordination symmetrically.  Skin: Skin is warm and dry. No rash noted. No erythema.  Psychiatric: She has a normal mood and affect. Her behavior is normal. Judgment and thought content normal.  Nursing note and vitals reviewed.  Patient is refusing pelvic exam.   ED Treatments / Results  Labs (all labs ordered are listed, but only abnormal results are displayed) Labs Reviewed  COMPREHENSIVE METABOLIC PANEL - Abnormal; Notable for the following:       Result Value   Calcium 8.7 (*)    Alkaline Phosphatase 33 (*)    All other components within normal limits  CBC - Abnormal; Notable for the following:    Platelets 144 (*)    All other components within normal limits  URINALYSIS, ROUTINE W REFLEX  MICROSCOPIC - Abnormal; Notable for the following:    Hgb urine dipstick LARGE (*)    Protein, ur 100 (*)    Bacteria, UA RARE (*)    Squamous Epithelial / LPF 0-5 (*)    All other components within normal limits  LIPASE, BLOOD  I-STAT BETA HCG BLOOD, ED ([facility], [facility], [facility] ONLY)    EKG  EKG Interpretation None       Radiology US Pelvis Complete  Result Date: 10/04/2017 CLINICAL DATA:  Left pelvic region pain EXAM: TRANSABDOMINAL ULTRASOUND OF PELVIS DOPPLER ULTRASOUND OF OVARIES TECHNIQUE: Transabdominal ultrasound examination of the pelvis was performed including evaluation of the uterus, ovaries, adnexal regions, and pelvic cul-de-sac. Color and duplex Doppler ultrasound was utilized to evaluate blood flow to the ovaries. COMPARISON:  Pelvic ultrasound April 01, 2017; CT abdomen and pelvis April 01, 2017 FINDINGS: Uterus Measurements: 10.8 x 11.1 x 8.5 cm. The echotexture of the uterus is diffusely inhomogeneous. There is a hypoechoic mass arising from the uterine fundus measuring 4.6 x 2.9 x 3.1 cm. There is a mass arising from the rightward fundus measuring 7.5 x 7.8 x 8.3 cm. A mass in the rightward aspect of the lower uterine segment measures 2.9 x 2.9 x 3.1 cm. These lesions are felt to represent leiomyomas. Endometrium Thickness: 9 mm. No focal abnormality visualized. Right ovary Measurements: 4.6 x 3.1 x 2.4 cm. Normal appearance/no adnexal mass. Left ovary Measurements: 2.9 x 1.8 x 2.2 cm. Normal appearance/no adnexal mass. Pulsed Doppler evaluation demonstrates normal low-resistance arterial and venous waveforms in both ovaries. No evident free pelvic fluid. IMPRESSION: Prominent leiomyomatous uterus. Largest leiomyoma appears larger compared to study performed 6 months previously. No extrauterine pelvic or adnexal mass. No ovarian torsion evident on either side. No free pelvic fluid. Electronically Signed   By: Lowella Grip III M.D.   On: 10/04/2017 07:59   [place] Art/ven Flow Abd Pelv  Doppler  Result Date: 10/04/2017 CLINICAL DATA:  Left pelvic region pain EXAM: TRANSABDOMINAL ULTRASOUND OF PELVIS DOPPLER ULTRASOUND OF OVARIES TECHNIQUE: Transabdominal ultrasound examination of the pelvis was performed including evaluation of the uterus, ovaries, adnexal regions, and pelvic cul-de-sac. Color and duplex Doppler ultrasound was utilized to evaluate blood flow to the ovaries. COMPARISON:  Pelvic ultrasound April 01, 2017; CT abdomen and pelvis April 01, 2017 FINDINGS: Uterus Measurements: 10.8 x 11.1 x 8.5 cm. The echotexture of the uterus is diffusely inhomogeneous. There is a hypoechoic mass arising from the uterine fundus measuring 4.6 x 2.9 x 3.1 cm. There is a mass arising from the rightward fundus measuring 7.5 x 7.8 x 8.3 cm. A mass in the rightward aspect of the lower uterine segment measures 2.9 x 2.9 x 3.1 cm. These lesions are felt to represent leiomyomas. Endometrium Thickness: 9 mm. No focal abnormality visualized. Right ovary Measurements: 4.6 x 3.1 x 2.4 cm. Normal appearance/no adnexal mass. Left ovary Measurements: 2.9 x 1.8 x 2.2 cm. Normal appearance/no adnexal mass. Pulsed Doppler evaluation demonstrates normal low-resistance arterial and venous waveforms in both ovaries. No evident free pelvic fluid. IMPRESSION: Prominent leiomyomatous uterus. Largest leiomyoma appears larger compared to study performed 6 months previously. No extrauterine pelvic or adnexal mass. No ovarian torsion evident on either side. No free pelvic fluid. Electronically Signed   By: Lowella Grip III M.D.   On: 10/04/2017 07:59    Procedures Procedures (including critical care time)  Medications Ordered in ED Medications  morphine 4 MG/ML injection 4 mg (4 mg Intravenous Given 10/04/17 0659)  ondansetron (ZOFRAN-ODT) disintegrating tablet 4 mg (4 mg Oral Given 10/04/17 0658)  ketorolac (TORADOL) 30 MG/ML injection 30 mg (30 mg Intravenous Given 10/04/17 0851)     Initial Impression /  Assessment and Plan / ED Course  I have reviewed the triage vital signs and the nursing notes.  Pertinent labs & imaging results that were available during my care of the patient were reviewed by me and considered in my medical decision making (see chart for details).  Clinical Course as of Oct 04 901  Fri Oct 04, 2017  0738 Patient verbally verified a safe ride from the ED. Proceeded with prescribing morphine for pain/relaxtion/muscle relaxation in the ED until ovarian torsion can be ruled out.  [AM]    Clinical Course User Index [AM] Albesa Seen, PA-C    Final Clinical Impressions(s) /  ED Diagnoses   Final diagnoses:  Menstrual pain  Isolated proteinuria with morphologic lesion  Lower abdominal pain   Patient is nontoxic-appearing, afebrile, and in no acute distress.  Patient reports that this pain is identical to prior menses pain.  Due to focal tenderness in left lower quadrant, will proceed to transvaginal ultrasound.  Patient is refusing pelvic exam today, and deferring any cultures for STI at this time.  Pelvic ultrasound demonstrates leiomyomas that are increased in size from prior evaluation.  There is no evidence of torsion or pelvic free fluid.  Lab work is reassuring, as there is no anemia, leukocytosis.   On reevaluation, pain is improved.  I discussed that leiomyomas have increased in size and thus pelvic ultrasound.  I discussed that patient requires follow-up with an OB/GYN.    Return precautions given for any focal abdominal tenderness, heavy vaginal bleeding, fever or chills with symptoms, or intractable nausea or vomiting.  I also discussed with patient that she requires a recheck of proteinuria by her primary care provider. Patient is in understanding and agrees with the plan of care.  This is a supervised visit with Dr. Orlie Dakin. Evaluation, management, and discharge planning discussed with this attending physician.  New Prescriptions New Prescriptions    NAPROXEN (NAPROSYN) 500 MG TABLET    Take 1 tablet (500 mg total) by mouth 2 (two) times daily.     Albesa Seen, PA-C 10/04/17 2376    Orlie Dakin, MD 10/04/17 1728

## 2017-10-04 NOTE — ED Triage Notes (Signed)
Patient complaining of lower abdominal pain. Patient states that she is having a bad period. Patient states it making he legs hurt. She also states she is having nausea and vomiting.

## 2017-11-01 ENCOUNTER — Ambulatory Visit (INDEPENDENT_AMBULATORY_CARE_PROVIDER_SITE_OTHER): Payer: 59 | Admitting: Obstetrics & Gynecology

## 2017-11-01 ENCOUNTER — Encounter: Payer: Self-pay | Admitting: Obstetrics & Gynecology

## 2017-11-01 VITALS — BP 115/86 | HR 80 | Ht 68.0 in | Wt 125.0 lb

## 2017-11-01 DIAGNOSIS — N946 Dysmenorrhea, unspecified: Secondary | ICD-10-CM | POA: Diagnosis not present

## 2017-11-01 DIAGNOSIS — D219 Benign neoplasm of connective and other soft tissue, unspecified: Secondary | ICD-10-CM

## 2017-11-01 MED ORDER — OXYCODONE-ACETAMINOPHEN 5-325 MG PO TABS: ORAL_TABLET | ORAL | 0 refills | Status: DC

## 2017-11-01 MED ORDER — NORGESTREL-ETHINYL ESTRADIOL 0.3-30 MG-MCG PO TABS: 1.0000 | ORAL_TABLET | Freq: Every day | ORAL | 11 refills | Status: DC

## 2017-11-01 MED ORDER — ALPRAZOLAM ER 1 MG PO TB24: ORAL_TABLET | ORAL | 0 refills | Status: DC

## 2017-11-01 NOTE — Progress Notes (Signed)
Patient ID: Sarah Greene, female   DOB: December 09, 1985, 31 y.o.   MRN: [id number]  Chief Complaint  Patient presents with  . Fibroids    HPI Sarah Greene is a 31 y.o. female. Single (dating) ) P0 here today for follow up after [facility] visit last month for "terrible pain" legs, back, and pelvis. This started at menarche 31 yo. The pain is almost every day during her period. It starts about 2 days prior to her period, also starts in her breasts.  She has pressure with voiding during her period as well as vomitting.  She has been treated with Tylenol #3. She has never tried OCPs to control the pain. HPI  Past Medical History:  Diagnosis Date  . Chronic cervical pain   . Chronic lower back pain   . Migraines   . Pneumonia 2005   3 episodes    Past Surgical History:  Procedure Laterality Date  . WISDOM TOOTH EXTRACTION      Family History  Problem Relation Age of Onset  . Hypertension Mother   . Arthritis Mother   . Hypertension Other   . Diabetes Other   . Asthma Brother     Social History Social History   Tobacco Use  . Smoking status: Current Some Day Smoker    Packs/day: 0.20    Types: Cigarettes  . Smokeless tobacco: Never Used  Substance Use Topics  . Alcohol use: Yes    Alcohol/week: 0.6 oz    Types: 1 Cans of beer per week    Comment: 1 beer daily  . Drug use: No    No Known Allergies  Current Outpatient Medications  Medication Sig Dispense Refill  . naproxen (NAPROSYN) 500 MG tablet Take 1 tablet (500 mg total) by mouth 2 (two) times daily. 30 tablet 0   No current facility-administered medications for this visit.     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 She does not want to ever give birth.  Blood pressure 115/86, pulse 80, height 5\' 8"  (1.727 m), weight 125 lb (56.7 kg), last menstrual period 10/28/2017.  Physical Exam Physical Exam Thin pleasant Black  female Abd- scaffoid, benign Her vagina is very small. The cervix is extremely posterior. The speculum is blocked by the posterior fibroid/cervical position (I am unable to obtain pap smear due to situation). Data Reviewed FINDINGS: Uterus  Measurements: 10.8 x 11.1 x 8.5 cm. The echotexture of the uterus is diffusely inhomogeneous. There is a hypoechoic mass arising from the uterine fundus measuring 4.6 x 2.9 x 3.1 cm. There is a mass arising from the rightward fundus measuring 7.5 x 7.8 x 8.3 cm. A mass in the rightward aspect of the lower uterine segment measures 2.9 x 2.9 x 3.1 cm. These lesions are felt to represent leiomyomas.  Endometrium  Thickness: 9 mm. No focal abnormality visualized.  Right ovary  Measurements: 4.6 x 3.1 x 2.4 cm. Normal appearance/no adnexal mass.  Left ovary  Measurements: 2.9 x 1.8 x 2.2 cm. Normal appearance/no adnexal mass.  Pulsed Doppler evaluation demonstrates normal low-resistance arterial and venous waveforms in both ovaries.  No evident free pelvic fluid.  IMPRESSION: Prominent leiomyomatous uterus. Largest leiomyoma appears larger compared to study performed 6 months previously.  No extrauterine pelvic or adnexal mass. No ovarian torsion evident on either side. No free pelvic fluid.    Assessment    Preventative care Fibroids, dysmenorrhea  Plan    Pap smear to be done after premedication with xanax/percocet Offered Gardasil Suggest that we start with OCPs, Consideder [place], hysterectomy prn    Follow up in 3 months   Hali Balgobin C Hubbard Seldon 11/01/2017, 8:52 AM

## 2017-11-13 ENCOUNTER — Encounter (HOSPITAL_COMMUNITY): Payer: Self-pay | Admitting: Family Medicine

## 2017-11-13 ENCOUNTER — Emergency Department (HOSPITAL_COMMUNITY): Payer: 59

## 2017-11-13 ENCOUNTER — Emergency Department (HOSPITAL_COMMUNITY)
Admission: EM | Admit: 2017-11-13 | Discharge: 2017-11-13 | Disposition: A | Discharge status: Home / Self Care | Payer: 59 | Attending: Emergency Medicine | Admitting: Emergency Medicine

## 2017-11-13 DIAGNOSIS — H8110 Benign paroxysmal vertigo, unspecified ear: Secondary | ICD-10-CM | POA: Diagnosis not present

## 2017-11-13 DIAGNOSIS — F17210 Nicotine dependence, cigarettes, uncomplicated: Secondary | ICD-10-CM | POA: Diagnosis not present

## 2017-11-13 DIAGNOSIS — R42 Dizziness and giddiness: Secondary | ICD-10-CM | POA: Diagnosis present

## 2017-11-13 LAB — BASIC METABOLIC PANEL
ANION GAP: 5 (ref 5–15)
BUN: 14 mg/dL (ref 6–20)
CALCIUM: 8.8 mg/dL — AB (ref 8.9–10.3)
CO2: 21 mmol/L — ABNORMAL LOW (ref 22–32)
Chloride: 110 mmol/L (ref 101–111)
Creatinine, Ser: 0.75 mg/dL (ref 0.44–1.00)
GLUCOSE: 110 mg/dL — AB (ref 65–99)
Potassium: 3.8 mmol/L (ref 3.5–5.1)
Sodium: 136 mmol/L (ref 135–145)

## 2017-11-13 LAB — CBC WITH DIFFERENTIAL/PLATELET
BASOS ABS: 0 10*3/uL (ref 0.0–0.1)
BASOS PCT: 0 %
EOS ABS: 0 10*3/uL (ref 0.0–0.7)
EOS PCT: 1 %
HCT: 36.2 % (ref 36.0–46.0)
Hemoglobin: 12.3 g/dL (ref 12.0–15.0)
Lymphocytes Relative: 37 %
Lymphs Abs: 1.3 10*3/uL (ref 0.7–4.0)
MCH: 31 pg (ref 26.0–34.0)
MCHC: 34 g/dL (ref 30.0–36.0)
MCV: 91.2 fL (ref 78.0–100.0)
MONO ABS: 0.3 10*3/uL (ref 0.1–1.0)
Monocytes Relative: 8 %
Neutro Abs: 2 10*3/uL (ref 1.7–7.7)
Neutrophils Relative %: 54 %
PLATELETS: 122 10*3/uL — AB (ref 150–400)
RBC: 3.97 MIL/uL (ref 3.87–5.11)
RDW: 13.1 % (ref 11.5–15.5)
WBC: 3.6 10*3/uL — ABNORMAL LOW (ref 4.0–10.5)

## 2017-11-13 LAB — I-STAT BETA HCG BLOOD, ED (MC, WL, AP ONLY): I-stat hCG, quantitative: <5 m[IU]/mL (ref <5)

## 2017-11-13 MED ORDER — LORAZEPAM 2 MG/ML IJ SOLN
1.0000 mg | Freq: Once | INTRAMUSCULAR | Status: AC
  Administered 2017-11-13: 1 mg via INTRAVENOUS
  Filled 2017-11-13: qty 1

## 2017-11-13 MED ORDER — MECLIZINE HCL 25 MG PO TABS
25.0000 mg | ORAL_TABLET | Freq: Once | ORAL | Status: AC
  Administered 2017-11-13: 25 mg via ORAL
  Filled 2017-11-13: qty 1

## 2017-11-13 MED ORDER — PROMETHAZINE HCL 25 MG PO TABS: 25.0000 mg | ORAL_TABLET | Freq: Four times a day (QID) | ORAL | 0 refills | Status: DC | PRN

## 2017-11-13 MED ORDER — METOCLOPRAMIDE HCL 5 MG/ML IJ SOLN
5.0000 mg | Freq: Once | INTRAMUSCULAR | Status: AC
  Administered 2017-11-13: 5 mg via INTRAVENOUS
  Filled 2017-11-13: qty 2

## 2017-11-13 MED ORDER — SODIUM CHLORIDE 0.9 % IV BOLUS (SEPSIS)
1000.0000 mL | Freq: Once | INTRAVENOUS | Status: AC
  Administered 2017-11-13: 1000 mL via INTRAVENOUS

## 2017-11-13 MED ORDER — MECLIZINE HCL 25 MG PO TABS: 25.0000 mg | ORAL_TABLET | Freq: Three times a day (TID) | ORAL | 0 refills | Status: DC | PRN

## 2017-11-13 MED ORDER — LORAZEPAM 1 MG PO TABS: 1.0000 mg | ORAL_TABLET | Freq: Three times a day (TID) | ORAL | 0 refills | Status: DC | PRN

## 2017-11-13 NOTE — ED Provider Notes (Signed)
[place] DEPT Provider Note   CSN: [id number] Arrival date & time: 11/13/17  1287     History   Chief Complaint Chief Complaint  Patient presents with  . Dizziness  . Emesis    HPI Sarah Greene is a 31 y.o. female.  Patient presents to the ER for evaluation of severe dizziness with nausea and vomiting.  Patient reports that she started experiencing dizziness approximately 3 weeks ago.  She feels like she is spinning when this occurs.  It is triggered by changes in position of her head.  She was seen by her primary doctor for this, given Zofran but it has not helped.  She continues to have nausea and vomiting associated with the vertigo symptoms.  Last night her symptoms significantly worsened.  She has not experiencing any headache.  She does not have any ringing in her ears or hearing loss.      Past Medical History:  Diagnosis Date  . Chronic cervical pain   . Chronic lower back pain   . Migraines   . Pneumonia 2005   3 episodes    There are no active problems to display for this patient.   Past Surgical History:  Procedure Laterality Date  . WISDOM TOOTH EXTRACTION      OB History    Gravida Para Term Preterm AB Living   0 0 0 0 0 0   SAB TAB Ectopic Multiple Live Births   0 0 0 0 0       Home Medications    Prior to Admission medications   Medication Sig Start Date End Date Taking? Authorizing Provider  LORazepam (ATIVAN) 1 MG tablet Take 1 tablet (1 mg total) by mouth every 8 (eight) hours as needed (severe dizziness). 11/13/17   Orpah Greek, MD  meclizine (ANTIVERT) 25 MG tablet Take 1 tablet (25 mg total) by mouth 3 (three) times daily as needed for dizziness. 11/13/17   Orpah Greek, MD  promethazine (PHENERGAN) 25 MG tablet Take 1 tablet (25 mg total) by mouth every 6 (six) hours as needed for nausea or vomiting. 11/13/17   Dawaun Brancato, Gwenyth Allegra, MD    Family History Family History    Problem Relation Age of Onset  . Hypertension Mother   . Arthritis Mother   . Hypertension Other   . Diabetes Other   . Asthma Brother     Social History Social History   Tobacco Use  . Smoking status: Current Some Day Smoker    Packs/day: 0.20    Types: Cigarettes  . Smokeless tobacco: Never Used  Substance Use Topics  . Alcohol use: Yes    Alcohol/week: 0.6 oz    Types: 1 Cans of beer per week    Comment: 1 beer daily  . Drug use: No     Allergies   Patient has no known allergies.   Review of Systems Review of Systems  Gastrointestinal: Positive for nausea and vomiting.  Neurological: Positive for dizziness.  All other systems reviewed and are negative.    Physical Exam Updated Vital Signs BP 108/76   Pulse 76   Temp 97.9 F (36.6 C) (Oral)   Resp 20   Ht 5\' 8"  (1.727 m)   Wt 58.1 kg (128 lb)   LMP 10/28/2017 (Exact Date)   SpO2 100%   BMI 19.46 kg/m   Physical Exam  Constitutional: She is oriented to person, place, and time. She appears well-developed and well-nourished. No distress.  HENT:  Head: Normocephalic and atraumatic.  Right Ear: Hearing normal.  Left Ear: Hearing normal.  Nose: Nose normal.  Mouth/Throat: Oropharynx is clear and moist and mucous membranes are normal.  Eyes: Conjunctivae and EOM are normal. Pupils are equal, round, and reactive to light.  Neck: Normal range of motion. Neck supple.  Cardiovascular: Regular rhythm, S1 normal and S2 normal. Exam reveals no gallop and no friction rub.  No murmur heard. Pulmonary/Chest: Effort normal and breath sounds normal. No respiratory distress. She exhibits no tenderness.  Abdominal: Soft. Normal appearance and bowel sounds are normal. There is no hepatosplenomegaly. There is no tenderness. There is no rebound, no guarding, no tenderness at McBurney's point and negative Murphy's sign. No hernia.  Musculoskeletal: Normal range of motion.  Neurological: She is alert and oriented to  person, place, and time. She has normal strength. No cranial nerve deficit or sensory deficit. Coordination normal. GCS eye subscore is 4. GCS verbal subscore is 5. GCS motor subscore is 6.  Skin: Skin is warm, dry and intact. No rash noted. No cyanosis.  Psychiatric: She has a normal mood and affect. Her speech is normal and behavior is normal. Thought content normal.  Nursing note and vitals reviewed.    ED Treatments / Results  Labs (all labs ordered are listed, but only abnormal results are displayed) Labs Reviewed  CBC WITH DIFFERENTIAL/PLATELET - Abnormal; Notable for the following components:      Result Value   WBC 3.6 (*)    Platelets 122 (*)    All other components within normal limits  BASIC METABOLIC PANEL - Abnormal; Notable for the following components:   CO2 21 (*)    Glucose, Bld 110 (*)    Calcium 8.8 (*)    All other components within normal limits  I-STAT BETA HCG BLOOD, ED ([facility], [facility], [facility] ONLY)    EKG  EKG Interpretation None       Radiology Ct Head Wo Contrast  Result Date: 11/13/2017 CLINICAL DATA:  Initial evaluation for acute vertigo. EXAM: CT HEAD WITHOUT CONTRAST TECHNIQUE: Contiguous axial images were obtained from the base of the skull through the vertex without intravenous contrast. COMPARISON:  None. FINDINGS: Brain: Cerebral volume within normal limits for patient age. No evidence for acute intracranial hemorrhage. No findings to suggest acute large vessel territory infarct. No mass lesion, midline shift, or mass effect. Ventricles are normal in size without evidence for hydrocephalus. No extra-axial fluid collection identified. Vascular: No hyperdense vessel identified. Skull: Scalp soft tissues demonstrate no acute abnormality.Calvarium intact. Sinuses/Orbits: Globes and orbital soft tissues are within normal limits. Visualized paranasal sinuses are clear. No mastoid effusion. IMPRESSION: Normal head CT.  No acute intracranial abnormality.  Electronically Signed   By: Jeannine Boga M.D.   On: 11/13/2017 06:17    Procedures Procedures (including critical care time)  Medications Ordered in ED Medications  sodium chloride 0.9 % bolus 1,000 mL (1,000 mLs Intravenous New Bag/Given 11/13/17 0545)  metoCLOPramide (REGLAN) injection 5 mg (5 mg Intravenous Given 11/13/17 0545)  meclizine (ANTIVERT) tablet 25 mg (25 mg Oral Given 11/13/17 0545)  LORazepam (ATIVAN) injection 1 mg (1 mg Intravenous Given 11/13/17 0544)     Initial Impression / Assessment and Plan / ED Course  I have reviewed the triage vital signs and the nursing notes.  Pertinent labs & imaging results that were available during my care of the patient were reviewed by me and considered in my medical decision making (see chart for details).  Patient presents to the ER for evaluation of vertigo.  Symptoms appear to be consistent with benign positional vertigo.  She has significant worsening of her symptoms with changes in position of her head.  She does not have any risk factors for central vertigo.  Patient treated with IV fluids, IV benzodiazepine, IV Reglan and meclizine.  Blood work and CT scan performed.  Workup is negative.  Recheck after administration of fluids and medications reveals that her dizziness has completely resolved, no further nausea or vomiting.  Exam is unremarkable at this time, continues to have a normal neurologic exam, abdominal exam is benign, nontender.  Patient will be discharged with symptomatic treatment.  Final Clinical Impressions(s) / ED Diagnoses   Final diagnoses:  Benign paroxysmal positional vertigo, unspecified laterality    ED Discharge Orders        Ordered    promethazine (PHENERGAN) 25 MG tablet  Every 6 hours PRN     11/13/17 0640    meclizine (ANTIVERT) 25 MG tablet  3 times daily PRN     11/13/17 0640    LORazepam (ATIVAN) 1 MG tablet  Every 8 hours PRN     11/13/17 0640       Orpah Greek,  MD 11/13/17 [phone]

## 2017-11-13 NOTE — ED Triage Notes (Signed)
Patient is complaining about dizziness that has been going "for weeks" and nausea/vomiting that started this morning when trying to get ready for work. Patient reports she vomited 3 times prior to arrival.

## 2018-05-19 ENCOUNTER — Ambulatory Visit: Payer: 59 | Admitting: Obstetrics & Gynecology

## 2018-05-19 ENCOUNTER — Encounter: Payer: Self-pay | Admitting: Obstetrics & Gynecology

## 2018-05-19 VITALS — BP 133/82 | HR 71 | Wt 132.7 lb

## 2018-05-19 DIAGNOSIS — D219 Benign neoplasm of connective and other soft tissue, unspecified: Secondary | ICD-10-CM

## 2018-05-19 DIAGNOSIS — Z124 Encounter for screening for malignant neoplasm of cervix: Secondary | ICD-10-CM | POA: Diagnosis not present

## 2018-05-19 DIAGNOSIS — Z1151 Encounter for screening for human papillomavirus (HPV): Secondary | ICD-10-CM | POA: Diagnosis not present

## 2018-05-19 DIAGNOSIS — Z23 Encounter for immunization: Secondary | ICD-10-CM | POA: Diagnosis not present

## 2018-05-19 DIAGNOSIS — Z01419 Encounter for gynecological examination (general) (routine) without abnormal findings: Secondary | ICD-10-CM | POA: Diagnosis not present

## 2018-05-19 HISTORY — DX: Benign neoplasm of connective and other soft tissue, unspecified: D21.9

## 2018-05-19 NOTE — Progress Notes (Signed)
Subjective:    Sarah Greene is a 32 y.o. single P0 female who presents for an annual exam. She still continues to have excruciating pelvic pain that causes her to go to the ER and miss work. She has large fibroids. The pain is mostly with her periods but does occur other times of the month.The patient is sexually active. GYN screening history: last pap: was normal. The patient wears seatbelts: yes. The patient participates in regular exercise: yes. Has the patient ever been transfused or tattooed?: yes. The patient reports that there is not domestic violence in her life.   Menstrual History: OB History    Gravida  0   Para  0   Term  0   Preterm  0   AB  0   Living  0     SAB  0   TAB  0   Ectopic  0   Multiple  0   Live Births  0           Menarche age: 69 Patient's last menstrual period was 05/17/2018 (exact date). Period Cycle (Days): 30 Period Duration (Days): 4 days  Period Pattern: Regular Menstrual Flow: Moderate Menstrual Control: Maxi pad Dysmenorrhea: (!) Severe Dysmenorrhea Symptoms: Cramping  The following portions of the patient's history were reviewed and updated as appropriate: allergies, current medications, past family history, past medical history, past social history, past surgical history and problem list.  Review of Systems Pertinent items are noted in HPI.   Fh- no breast, gyn cancer + colon cancer maternal GM She is a dialysis tech for [employer] She is in a monogamous same sex relationship, they live together   Objective:    BP 133/82   Pulse 71   Wt 132 lb 11.2 oz (60.2 kg)   LMP 05/17/2018 (Exact Date)   BMI 20.18 kg/m   General Appearance:    Alert, cooperative, no distress, appears stated age  Head:    Normocephalic, without obvious abnormality, atraumatic  Eyes:    PERRL, conjunctiva/corneas clear, EOM's intact, fundi    benign, both eyes  Ears:    Normal TM's and external ear canals, both ears  Nose:   Nares normal,  septum midline, mucosa normal, no drainage    or sinus tenderness  Throat:   Lips, mucosa, and tongue normal; teeth and gums normal  Neck:   Supple, symmetrical, trachea midline, no adenopathy;    thyroid:  no enlargement/tenderness/nodules; no carotid   bruit or JVD  Back:     Symmetric, no curvature, ROM normal, no CVA tenderness  Lungs:     Clear to auscultation bilaterally, respirations unlabored  Chest Wall:    No tenderness or deformity   Heart:    Regular rate and rhythm, S1 and S2 normal, no murmur, rub   or gallop  Breast Exam:    No tenderness, masses, or nipple abnormality  Abdomen:     Soft, non-tender, bowel sounds active all four quadrants,    no masses, no organomegaly  Genitalia:    Normal female without lesion, discharge or tenderness, very large posterior fibroid, fixed uterus, about 14 week size     Extremities:   Extremities normal, atraumatic, no cyanosis or edema  Pulses:   2+ and symmetric all extremities  Skin:   Skin color, texture, turgor normal, no rashes or lesions  Lymph nodes:   Cervical, supraclavicular, and axillary nodes normal  Neurologic:   CNII-XII intact, normal strength, sensation and reflexes  throughout  .    Assessment:    Healthy female exam.   Large fibroids with severe dysmenorrhea She wants a hysterectomy. We have discussed that if her uterus is removed, then there is zero chance that she can carry a pregnancy.   Plan:     Thin prep Pap smear. with cotesting Schedule her for a TAH/BS

## 2018-05-20 LAB — CYTOLOGY - PAP
Diagnosis: NEGATIVE
HPV (WINDOPATH): NOT DETECTED

## 2018-06-04 ENCOUNTER — Encounter (HOSPITAL_COMMUNITY): Payer: Self-pay

## 2018-07-18 NOTE — Patient Instructions (Addendum)
Your procedure is scheduled on: Tuesday August 12, 2018 at 7:30 am  Enter through the [facility] at: 6:00 am  Pick up the phone at the desk and dial [phone].  Call this number if you have problems the morning of surgery: [phone].  Remember: Do NOT eat food or Do Not Drink drink any liquids (including water) after Midnight on Monday September 9  Take these medicines the morning of surgery with a SIP OF WATER:  Meclizine  STOP ALL VITAMINS, SUPPLEMENTS, HERBAL MEDICATIONS, IBUPROFEN/NSAIDS NOW  DO NOT SMOKE DAY OF SURGERY   BRUSH YOUR TEETH DAY OF SURGERY  Do NOT wear jewelry (body piercing), metal hair clips/bobby pins, make-up, or nail polish. Do NOT wear lotions, powders, or perfumes.  You may wear deoderant. Do NOT shave for 48 hours prior to surgery. Do NOT bring valuables to the hospital. Contacts, dentures, or bridgework may not be worn into surgery. Leave suitcase in car.  After surgery it may be brought to your room.    For patients admitted to the hospital, checkout time is 11:00 AM the day of discharge.

## 2018-07-30 ENCOUNTER — Encounter (HOSPITAL_COMMUNITY)
Admission: RE | Admit: 2018-07-30 | Discharge: 2018-07-30 | Disposition: A | Discharge status: Home / Self Care | Payer: 59 | Source: Ambulatory Visit | Attending: Obstetrics & Gynecology | Admitting: Obstetrics & Gynecology

## 2018-07-30 ENCOUNTER — Encounter (HOSPITAL_COMMUNITY): Payer: Self-pay

## 2018-07-30 ENCOUNTER — Other Ambulatory Visit: Payer: Self-pay

## 2018-07-30 DIAGNOSIS — Z01812 Encounter for preprocedural laboratory examination: Secondary | ICD-10-CM | POA: Insufficient documentation

## 2018-07-30 HISTORY — DX: Nausea with vomiting, unspecified: R11.2

## 2018-07-30 HISTORY — DX: Nausea with vomiting, unspecified: Z98.890

## 2018-07-30 HISTORY — DX: Family history of other specified conditions: Z84.89

## 2018-07-30 LAB — CBC
HEMATOCRIT: 39.9 % (ref 36.0–46.0)
HEMOGLOBIN: 13 g/dL (ref 12.0–15.0)
MCH: 30.5 pg (ref 26.0–34.0)
MCHC: 32.6 g/dL (ref 30.0–36.0)
MCV: 93.7 fL (ref 78.0–100.0)
PLATELETS: 138 10*3/uL — AB (ref 150–400)
RBC: 4.26 MIL/uL (ref 3.87–5.11)
RDW: 13.7 % (ref 11.5–15.5)
WBC: 4.6 10*3/uL (ref 4.0–10.5)

## 2018-07-30 NOTE — Pre-Procedure Instructions (Signed)
PATIENT HAS CHRONIC LOW PLATLETS

## 2018-07-30 NOTE — Pre-Procedure Instructions (Signed)
DR. Lanetta Inch AWARE OF PLATLET COUNT OF 136.

## 2018-08-11 ENCOUNTER — Encounter (HOSPITAL_COMMUNITY): Payer: Self-pay | Admitting: Anesthesiology

## 2018-08-11 NOTE — Anesthesia Preprocedure Evaluation (Addendum)
Anesthesia Evaluation  Patient identified by MRN, date of birth, ID band Patient awake    Reviewed: Allergy & Precautions, H&P , NPO status , Patient's Chart, lab work & pertinent test results  History of Anesthesia Complications (+) PONV and history of anesthetic complications  Airway Mallampati: I  TM Distance: >3 FB Neck ROM: full    Dental no notable dental hx. (+) Teeth Intact   Pulmonary former smoker,    Pulmonary exam normal breath sounds clear to auscultation       Cardiovascular negative cardio ROS Normal cardiovascular exam Rhythm:regular Rate:Normal     Neuro/Psych negative psych ROS   GI/Hepatic negative GI ROS, Neg liver ROS,   Endo/Other  negative endocrine ROS  Renal/GU negative Renal ROS  negative genitourinary   Musculoskeletal negative musculoskeletal ROS (+)   Abdominal Normal abdominal exam  (+)   Peds  Hematology negative hematology ROS (+)   Anesthesia Other Findings   Reproductive/Obstetrics negative OB ROS                            Anesthesia Physical Anesthesia Plan  ASA: II  Anesthesia Plan: General   Post-op Pain Management:    Induction: Intravenous  PONV Risk Score and Plan: 4 or greater and Ondansetron, Dexamethasone, Scopolamine patch - Pre-op and Midazolam  Airway Management Planned: Oral ETT  Additional Equipment:   Intra-op Plan:   Post-operative Plan: Extubation in OR  Informed Consent: I have reviewed the patients History and Physical, chart, labs and discussed the procedure including the risks, benefits and alternatives for the proposed anesthesia with the patient or authorized representative who has indicated his/her understanding and acceptance.   Dental Advisory Given and History available from chart only  Plan Discussed with: CRNA and Surgeon  Anesthesia Plan Comments:        Anesthesia Quick Evaluation

## 2018-08-12 ENCOUNTER — Other Ambulatory Visit: Payer: Self-pay

## 2018-08-12 ENCOUNTER — Inpatient Hospital Stay (HOSPITAL_COMMUNITY)
Admission: RE | Admit: 2018-08-12 | Discharge: 2018-08-13 | DRG: 743 | Disposition: A | Discharge status: Home / Self Care | Payer: 59 | Attending: Obstetrics & Gynecology | Admitting: Obstetrics & Gynecology

## 2018-08-12 ENCOUNTER — Encounter (HOSPITAL_COMMUNITY)
Admission: RE | Disposition: A | Discharge status: Home / Self Care | Payer: Self-pay | Source: Home / Self Care | Attending: Obstetrics & Gynecology

## 2018-08-12 ENCOUNTER — Encounter (HOSPITAL_COMMUNITY): Payer: Self-pay | Admitting: Emergency Medicine

## 2018-08-12 ENCOUNTER — Inpatient Hospital Stay (HOSPITAL_COMMUNITY): Payer: 59 | Admitting: Anesthesiology

## 2018-08-12 DIAGNOSIS — Z87891 Personal history of nicotine dependence: Secondary | ICD-10-CM

## 2018-08-12 DIAGNOSIS — R102 Pelvic and perineal pain: Secondary | ICD-10-CM

## 2018-08-12 DIAGNOSIS — N946 Dysmenorrhea, unspecified: Secondary | ICD-10-CM

## 2018-08-12 DIAGNOSIS — D259 Leiomyoma of uterus, unspecified: Principal | ICD-10-CM | POA: Diagnosis present

## 2018-08-12 DIAGNOSIS — G8929 Other chronic pain: Secondary | ICD-10-CM | POA: Diagnosis present

## 2018-08-12 DIAGNOSIS — Z9889 Other specified postprocedural states: Secondary | ICD-10-CM

## 2018-08-12 HISTORY — PX: HYSTERECTOMY ABDOMINAL WITH SALPINGECTOMY: SHX6725

## 2018-08-12 LAB — PREGNANCY, URINE: Preg Test, Ur: NEGATIVE

## 2018-08-12 SURGERY — HYSTERECTOMY, TOTAL, ABDOMINAL, WITH SALPINGECTOMY
Anesthesia: General | Site: Abdomen | Laterality: Bilateral

## 2018-08-12 MED ORDER — PROPOFOL 10 MG/ML IV BOLUS
INTRAVENOUS | Status: AC
  Filled 2018-08-12: qty 20

## 2018-08-12 MED ORDER — MIDAZOLAM HCL 2 MG/2ML IJ SOLN
INTRAMUSCULAR | Status: AC
  Filled 2018-08-12: qty 2

## 2018-08-12 MED ORDER — ONDANSETRON HCL 4 MG/2ML IJ SOLN
INTRAMUSCULAR | Status: DC | PRN
  Administered 2018-08-12: 4 mg via INTRAVENOUS

## 2018-08-12 MED ORDER — FENTANYL CITRATE (PF) 250 MCG/5ML IJ SOLN
INTRAMUSCULAR | Status: AC
  Filled 2018-08-12: qty 5

## 2018-08-12 MED ORDER — LACTATED RINGERS IV SOLN
INTRAVENOUS | Status: DC
  Administered 2018-08-12: 11:00:00 via INTRAVENOUS

## 2018-08-12 MED ORDER — BUPIVACAINE HCL (PF) 0.5 % IJ SOLN
INTRAMUSCULAR | Status: AC
  Filled 2018-08-12: qty 30

## 2018-08-12 MED ORDER — HYDROMORPHONE HCL 1 MG/ML IJ SOLN
0.2500 mg | INTRAMUSCULAR | Status: DC | PRN
  Administered 2018-08-12 (×4): 0.5 mg via INTRAVENOUS

## 2018-08-12 MED ORDER — CEFAZOLIN SODIUM-DEXTROSE 2-4 GM/100ML-% IV SOLN
2.0000 g | INTRAVENOUS | Status: AC
  Administered 2018-08-12: 2 g via INTRAVENOUS

## 2018-08-12 MED ORDER — LIDOCAINE HCL (PF) 1 % IJ SOLN
INTRAMUSCULAR | Status: AC
  Filled 2018-08-12: qty 5

## 2018-08-12 MED ORDER — ONDANSETRON HCL 4 MG/2ML IJ SOLN
4.0000 mg | Freq: Four times a day (QID) | INTRAMUSCULAR | Status: DC | PRN
  Administered 2018-08-12: 4 mg via INTRAVENOUS
  Filled 2018-08-12: qty 2

## 2018-08-12 MED ORDER — CEFAZOLIN SODIUM-DEXTROSE 2-4 GM/100ML-% IV SOLN
INTRAVENOUS | Status: AC
  Filled 2018-08-12: qty 100

## 2018-08-12 MED ORDER — PROMETHAZINE HCL 25 MG/ML IJ SOLN
INTRAMUSCULAR | Status: AC
  Filled 2018-08-12: qty 1

## 2018-08-12 MED ORDER — IBUPROFEN 800 MG PO TABS
800.0000 mg | ORAL_TABLET | Freq: Three times a day (TID) | ORAL | Status: DC | PRN
  Administered 2018-08-13: 800 mg via ORAL
  Filled 2018-08-12: qty 1

## 2018-08-12 MED ORDER — KETOROLAC TROMETHAMINE 30 MG/ML IJ SOLN
INTRAMUSCULAR | Status: DC | PRN
  Administered 2018-08-12: 30 mg via INTRAVENOUS

## 2018-08-12 MED ORDER — ROCURONIUM BROMIDE 100 MG/10ML IV SOLN
INTRAVENOUS | Status: DC | PRN
  Administered 2018-08-12: 40 mg via INTRAVENOUS

## 2018-08-12 MED ORDER — ONDANSETRON HCL 4 MG PO TABS: 4.0000 mg | ORAL_TABLET | Freq: Four times a day (QID) | ORAL | Status: DC | PRN

## 2018-08-12 MED ORDER — DEXAMETHASONE SODIUM PHOSPHATE 4 MG/ML IJ SOLN
INTRAMUSCULAR | Status: AC
  Filled 2018-08-12: qty 1

## 2018-08-12 MED ORDER — FENTANYL CITRATE (PF) 100 MCG/2ML IJ SOLN
INTRAMUSCULAR | Status: DC | PRN
  Administered 2018-08-12: 100 ug via INTRAVENOUS
  Administered 2018-08-12 (×3): 50 ug via INTRAVENOUS

## 2018-08-12 MED ORDER — SUGAMMADEX SODIUM 200 MG/2ML IV SOLN
INTRAVENOUS | Status: DC | PRN
  Administered 2018-08-12: 150 mg via INTRAVENOUS

## 2018-08-12 MED ORDER — MEPERIDINE HCL 25 MG/ML IJ SOLN: 6.2500 mg | INTRAMUSCULAR | Status: DC | PRN

## 2018-08-12 MED ORDER — GLYCOPYRROLATE 0.2 MG/ML IJ SOLN
INTRAMUSCULAR | Status: DC | PRN
  Administered 2018-08-12: 0.1 mg via INTRAVENOUS

## 2018-08-12 MED ORDER — LIDOCAINE HCL (CARDIAC) PF 100 MG/5ML IV SOSY
PREFILLED_SYRINGE | INTRAVENOUS | Status: DC | PRN
  Administered 2018-08-12: 50 mg via INTRAVENOUS

## 2018-08-12 MED ORDER — KETOROLAC TROMETHAMINE 30 MG/ML IJ SOLN: 30.0000 mg | Freq: Once | INTRAMUSCULAR | Status: DC | PRN

## 2018-08-12 MED ORDER — LACTATED RINGERS IV SOLN
INTRAVENOUS | Status: DC
  Administered 2018-08-12 (×3): via INTRAVENOUS

## 2018-08-12 MED ORDER — PROPOFOL 10 MG/ML IV BOLUS
INTRAVENOUS | Status: DC | PRN
  Administered 2018-08-12: 200 mg via INTRAVENOUS

## 2018-08-12 MED ORDER — ONDANSETRON HCL 4 MG/2ML IJ SOLN
INTRAMUSCULAR | Status: AC
  Filled 2018-08-12: qty 2

## 2018-08-12 MED ORDER — BUPIVACAINE HCL (PF) 0.5 % IJ SOLN
INTRAMUSCULAR | Status: DC | PRN
  Administered 2018-08-12: 30 mL

## 2018-08-12 MED ORDER — SODIUM CHLORIDE 0.9 % IR SOLN
Status: DC | PRN
  Administered 2018-08-12: 1000 mL

## 2018-08-12 MED ORDER — KETOROLAC TROMETHAMINE 30 MG/ML IJ SOLN
INTRAMUSCULAR | Status: AC
  Filled 2018-08-12: qty 1

## 2018-08-12 MED ORDER — SCOPOLAMINE 1 MG/3DAYS TD PT72
MEDICATED_PATCH | TRANSDERMAL | Status: AC
  Administered 2018-08-12: 1.5 mg via TRANSDERMAL
  Filled 2018-08-12: qty 1

## 2018-08-12 MED ORDER — SCOPOLAMINE 1 MG/3DAYS TD PT72
1.0000 | MEDICATED_PATCH | Freq: Once | TRANSDERMAL | Status: DC
  Administered 2018-08-12: 1.5 mg via TRANSDERMAL

## 2018-08-12 MED ORDER — SUGAMMADEX SODIUM 200 MG/2ML IV SOLN
INTRAVENOUS | Status: AC
  Filled 2018-08-12: qty 2

## 2018-08-12 MED ORDER — MIDAZOLAM HCL 2 MG/2ML IJ SOLN
INTRAMUSCULAR | Status: DC | PRN
  Administered 2018-08-12: 2 mg via INTRAVENOUS

## 2018-08-12 MED ORDER — DEXAMETHASONE SODIUM PHOSPHATE 10 MG/ML IJ SOLN
INTRAMUSCULAR | Status: DC | PRN
  Administered 2018-08-12: 4 mg via INTRAVENOUS

## 2018-08-12 MED ORDER — HYDROMORPHONE HCL 1 MG/ML IJ SOLN
INTRAMUSCULAR | Status: AC
  Administered 2018-08-12: 0.5 mg via INTRAVENOUS
  Filled 2018-08-12: qty 1

## 2018-08-12 MED ORDER — PROMETHAZINE HCL 25 MG/ML IJ SOLN
6.2500 mg | INTRAMUSCULAR | Status: DC | PRN
  Administered 2018-08-12: 6.25 mg via INTRAVENOUS

## 2018-08-12 MED ORDER — HYDROMORPHONE HCL 1 MG/ML IJ SOLN
0.2000 mg | INTRAMUSCULAR | Status: DC | PRN
  Administered 2018-08-12: 0.6 mg via INTRAVENOUS
  Administered 2018-08-12: 0.2 mg via INTRAVENOUS
  Administered 2018-08-12: 0.6 mg via INTRAVENOUS
  Administered 2018-08-12: 0.2 mg via INTRAVENOUS
  Administered 2018-08-12: 0.6 mg via INTRAVENOUS
  Administered 2018-08-13: 0.2 mg via INTRAVENOUS
  Filled 2018-08-12 (×6): qty 1

## 2018-08-12 SURGICAL SUPPLY — 35 items
CANISTER SUCT 3000ML PPV (MISCELLANEOUS) ×3 IMPLANT
CLOSURE WOUND 1/2 X4 (GAUZE/BANDAGES/DRESSINGS) ×1
CONT PATH 16OZ SNAP LID 3702 (MISCELLANEOUS) ×3 IMPLANT
DECANTER SPIKE VIAL GLASS SM (MISCELLANEOUS) IMPLANT
DRAPE CESAREAN BIRTH W POUCH (DRAPES) ×3 IMPLANT
DRAPE WARM FLUID 44X44 (DRAPE) IMPLANT
DRSG OPSITE POSTOP 4X10 (GAUZE/BANDAGES/DRESSINGS) ×3 IMPLANT
DURAPREP 26ML APPLICATOR (WOUND CARE) ×3 IMPLANT
GAUZE 4X4 16PLY RFD (DISPOSABLE) ×3 IMPLANT
GLOVE BIO SURGEON STRL SZ 6.5 (GLOVE) ×2 IMPLANT
GLOVE BIO SURGEONS STRL SZ 6.5 (GLOVE) ×1
GLOVE BIOGEL PI IND STRL 7.0 (GLOVE) ×2 IMPLANT
GLOVE BIOGEL PI INDICATOR 7.0 (GLOVE) ×4
GOWN STRL REUS W/TWL LRG LVL3 (GOWN DISPOSABLE) ×9 IMPLANT
HEMOSTAT ARISTA ABSORB 3G PWDR (MISCELLANEOUS) IMPLANT
NEEDLE SPNL 18GX3.5 QUINCKE PK (NEEDLE) ×3 IMPLANT
NS IRRIG 1000ML POUR BTL (IV SOLUTION) ×3 IMPLANT
PACK ABDOMINAL GYN (CUSTOM PROCEDURE TRAY) ×3 IMPLANT
PAD OB MATERNITY 4.3X12.25 (PERSONAL CARE ITEMS) ×3 IMPLANT
PENCIL SMOKE EVAC W/HOLSTER (ELECTROSURGICAL) ×3 IMPLANT
PROTECTOR NERVE ULNAR (MISCELLANEOUS) ×6 IMPLANT
SPONGE LAP 18X18 X RAY DECT (DISPOSABLE) ×3 IMPLANT
STRIP CLOSURE SKIN 1/2X4 (GAUZE/BANDAGES/DRESSINGS) ×2 IMPLANT
SUT CHROMIC 3 0 SH 27 (SUTURE) IMPLANT
SUT PDS AB 0 CTX 60 (SUTURE) ×3 IMPLANT
SUT VIC AB 0 CT1 36 (SUTURE) ×3 IMPLANT
SUT VIC AB 2-0 CT1 18 (SUTURE) ×9 IMPLANT
SUT VIC AB 3-0 CT1 27 (SUTURE) ×2
SUT VIC AB 3-0 CT1 TAPERPNT 27 (SUTURE) ×1 IMPLANT
SYR 30ML LL (SYRINGE) ×3 IMPLANT
TOWEL OR 17X24 6PK STRL BLUE (TOWEL DISPOSABLE) ×6 IMPLANT
TRAY FOLEY W/BAG SLVR 14FR (SET/KITS/TRAYS/PACK) ×3 IMPLANT
TUBING NON-CON 1/4 X 20 CONN (TUBING) ×2 IMPLANT
TUBING NON-CON 1/4 X 20' CONN (TUBING) ×1
YANKAUER SUCT BULB TIP NO VENT (SUCTIONS) ×3 IMPLANT

## 2018-08-12 NOTE — Anesthesia Procedure Notes (Signed)
Procedure Name: Intubation Date/Time: 08/12/2018 7:38 AM Performed by: Bufford Spikes, CRNA Pre-anesthesia Checklist: Patient identified, Emergency Drugs available, Suction available and Patient being monitored Patient Re-evaluated:Patient Re-evaluated prior to induction Oxygen Delivery Method: Circle system utilized Preoxygenation: Pre-oxygenation with 100% oxygen Induction Type: IV induction Ventilation: Mask ventilation without difficulty Laryngoscope Size: Glidescope and 3 Grade View: Grade I Tube type: Oral Tube size: 7.0 mm Number of attempts: 1 Airway Equipment and Method: Stylet and Oral airway Placement Confirmation: ETT inserted through vocal cords under direct vision,  positive ETCO2 and breath sounds checked- equal and bilateral Secured at: 21 cm Tube secured with: Tape Dental Injury: Teeth and Oropharynx as per pre-operative assessment

## 2018-08-12 NOTE — Discharge Instructions (Signed)
Abdominal Hysterectomy, Care After °This sheet gives you information about how to care for yourself after your procedure. Your doctor may also give you more specific instructions. If you have problems or questions, contact your doctor. °Follow these instructions at home: °Bathing °· Do not take baths, swim, or use a hot tub until your doctor says it is okay. Ask your doctor if you can take showers. You may only be allowed to take sponge baths for bathing. °· Keep the bandage (dressing) dry until your doctor says it can be taken off. °Surgical cut ( °incision) care °· Follow instructions from your doctor about how to take care of your cut from surgery. Make sure you: °? Wash your hands with soap and water before you change your bandage (dressing). If you cannot use soap and water, use hand sanitizer. °? Change your bandage as told by your doctor. °? Leave stitches (sutures), skin glue, or skin tape (adhesive) strips in place. They may need to stay in place for 2 weeks or longer. If tape strips get loose and curl up, you may trim the loose edges. Do not remove tape strips completely unless your doctor says it is okay. °· Check your surgical cut area every day for signs of infection. Check for: °? Redness, swelling, or pain. °? Fluid or blood. °? Warmth. °? Pus or a bad smell. °Activity °· Do gentle, daily exercise as told by your doctor. You may be told to take short walks every day and go farther each time. °· Do not lift anything that is heavier than 10 lb (4.5 kg), or the limit that your doctor tells you, until he or she says that it is safe. °· Do not drive or use heavy machinery while taking prescription pain medicine. °· Do not drive for 24 hours if you were given a medicine to help you relax (sedative). °· Follow your doctor's advice about exercise, driving, and general activities. Ask your doctor what activities are safe for you. °Lifestyle °· Do not douche, use tampons, or have sex for at least 6 weeks or as  told by your doctor. °· Do not drink alcohol until your doctor says it is okay. °· Drink enough fluid to keep your pee (urine) clear or pale yellow. °· Try to have someone at home with you for the first 1-2 weeks to help. °· Do not use any products that contain nicotine or tobacco, such as cigarettes and e-cigarettes. These can slow down healing. If you need help quitting, ask your doctor. °General instructions °· Take over-the-counter and prescription medicines only as told by your doctor. °· Do not take aspirin or ibuprofen. These medicines can cause bleeding. °· To prevent or treat constipation while you are taking prescription pain medicine, your doctor may suggest that you: °? Drink enough fluid to keep your urine clear or pale yellow. °? Take over-the-counter or prescription medicines. °? Eat foods that are high in fiber, such as: °§ Fresh fruits and vegetables. °§ Whole grains. °§ Beans. °? Limit foods that are high in fat and processed sugars, such as fried and sweet foods. °· Keep all follow-up visits as told by your doctor. This is important. °Contact a doctor if: °· You have chills or fever. °· You have redness, swelling, or pain around your cut. °· You have fluid or blood coming from your cut. °· Your cut feels warm to the touch. °· You have pus or a bad smell coming from your cut. °· Your cut breaks   open. °· You feel dizzy or light-headed. °· You have pain or bleeding when you pee. °· You keep having watery poop (diarrhea). °· You keep feeling sick to your stomach (nauseous) or keep throwing up (vomiting). °· You have unusual fluid (discharge) coming from your vagina. °· You have a rash. °· You have a reaction to your medicine. °· Your pain medicine does not help. °Get help right away if: °· You have a fever and your symptoms get worse all of a sudden. °· You have very bad belly (abdominal) pain. °· You are short of breath. °· You pass out (faint). °· You have pain, swelling, or redness of your  leg. °· You bleed a lot from your vagina and notice clumps of blood (clots). °Summary °· Do not take baths, swim, or use a hot tub until your doctor says it is okay. Ask your doctor if you can take showers. You may only be allowed to take sponge baths for bathing. °· Follow your doctor's advice about exercise, driving, and general activities. Ask your doctor what activities are safe for you. °· Do not lift anything that is heavier than 10 lb (4.5 kg), or the limit that your doctor tells you, until he or she says that it is safe. °· Try to have someone at home with you for the first 1-2 weeks to help. °This information is not intended to replace advice given to you by your health care provider. Make sure you discuss any questions you have with your health care provider. °Document Released: 08/28/2008 Document Revised: 11/07/2016 Document Reviewed: 11/07/2016 °Elsevier Interactive Patient Education © 2017 [employer] ° °

## 2018-08-12 NOTE — Transfer of Care (Signed)
Immediate Anesthesia Transfer of Care Note  Patient: Tinsley Everman  Procedure(s) Performed: HYSTERECTOMY ABDOMINAL WITH SALPINGECTOMY (Bilateral Abdomen)  Patient Location: PACU  Anesthesia Type:General  Level of Consciousness: awake, alert  and oriented  Airway & Oxygen Therapy: Patient Spontanous Breathing and Patient connected to nasal cannula oxygen  Post-op Assessment: Report given to RN and Post -op Vital signs reviewed and stable  Post vital signs: Reviewed and stable HR 100, RR 20, SaO2 100%, BP 121/79  Last Vitals:  Vitals Value Taken Time  BP    Temp    Pulse    Resp    SpO2      Last Pain:  Vitals:   08/12/18 0633  TempSrc: Oral      Patients Stated Pain Goal: 3 (20/25/42 7062)  Complications: No apparent anesthesia complications

## 2018-08-12 NOTE — Plan of Care (Signed)
  Problem: Pain Managment: Goal: General experience of comfort will improve Outcome: Progressing   Problem: Safety: Goal: Ability to remain free from injury will improve Outcome: Progressing   Problem: Education: Goal: Knowledge of the prescribed therapeutic regimen will improve Outcome: Progressing

## 2018-08-12 NOTE — Anesthesia Postprocedure Evaluation (Signed)
Anesthesia Post Note  Patient: Sarah Greene  Procedure(s) Performed: HYSTERECTOMY ABDOMINAL WITH SALPINGECTOMY (Bilateral Abdomen)     Patient location during evaluation: PACU Anesthesia Type: General Level of consciousness: awake Pain management: pain level controlled Vital Signs Assessment: post-procedure vital signs reviewed and stable Respiratory status: spontaneous breathing Cardiovascular status: stable Postop Assessment: no apparent nausea or vomiting Anesthetic complications: no    Last Vitals:  Vitals:   08/12/18 1037 08/12/18 1133  BP: (!) 136/93 117/71  Pulse: 85 80  Resp: 18 18  Temp: 36.7 C 37.5 C  SpO2: 100% 100%    Last Pain:  Vitals:   08/12/18 1523  TempSrc:   PainSc: Asleep   Pain Goal: Patients Stated Pain Goal: 3 (08/12/18 1329)               Antrice Pal JR,JOHN Mateo Flow

## 2018-08-12 NOTE — Op Note (Addendum)
08/12/2018  8:35 AM  PATIENT:  Sarah Greene  32 y.o. female  PRE-OPERATIVE DIAGNOSIS: chronic pelvic pain, fibroids Dsymenorrhea  POST-OPERATIVE DIAGNOSIS:  Chronic pelvic pain, fibroids Dsymenorrhea  PROCEDURE:  Procedure(s): HYSTERECTOMY ABDOMINAL WITH SALPINGECTOMY (Bilateral)  SURGEON:  Surgeon(s) and Role:    * Hulan Fray, Wilhemina Cash, MD - Primary    * Chancy Milroy, MD - Assisting   ASSISTANT: Ethelene Browns, MS3  ANESTHESIA:   local and general  EBL:  200 mL   BLOOD ADMINISTERED:none  DRAINS: Urinary Catheter (Foley)   LOCAL MEDICATIONS USED:  MARCAINE     SPECIMEN:  Source of Specimen:  uterus and tubes  DISPOSITION OF SPECIMEN:  PATHOLOGY  COUNTS:  YES  TOURNIQUET:  * No tourniquets in log *  DICTATION: .Dragon Dictation  PLAN OF CARE: Admit to inpatient   PATIENT DISPOSITION:  PACU - hemodynamically stable.   Delay start of Pharmacological VTE agent (>24hrs) due to surgical blood loss or risk of bleeding: not applicable     The risks, benefits, and alternatives of surgery were explained, understood, and accepted. Consents were signed. All questions were answered. She was taken to the operating room and general anesthesia was applied without complication. Her abdomen and vagina were prepped and draped in the usual sterile fashion. A Foley catheter was placed which drained clear urine throughout the case. A transverse incision was made approximately 2 cm above her symphysis pubis after injecting 30 mL of 0.5% marcaine in the subcutaneous tissue. The incision was carried down through the subcutaneous tissue to the fascia. Bleeding encountered was cauterized with the Bovie. The fascia was scored the midline and the fascial incision was extended bilaterally. The pyramidalis muscles were separated in a transverse fashion using electrosurgical technique. Approximately 1 cm of the rectus muscles were separated in a transverse fashion in the midline using electrosurgical  technique. Hemostasis was maintained. The peritoneum was entered with hemostats and the peritoneal incision was extended bilaterally with the Bovie, taking care to avoid bowel and bladder. The patient was placed in Trendelenburg position and her bowel was packed out of the abdominal cavity. The pelvis was inspected. Her very large uterus filled the entire pelvis. I used towel clamps to elevate the uterus out of the incision. Coker clamps were used to elevate the uterus. Moist sponges were used to pack the bowel out of the operative site. The round ligaments were identified clamped cut and ligated. A bladder flap was created anteriorly and the bladder was pushed out of the operative site with a moist lap sponge. The uteroovarian ligaments were identified bilaterally. They were clamped, cut, and ligated. Excellent hemostasis was noted. 2-0 Vicryl sutures used throughout this case unless otherwise specified. The uterine vessels were skeletonized, clamped, cut, and doubly ligated.  The remainder of the long nulliparous cervix was separated from its pelvic attachments using the same clamp, cut, ligate technique. Curved Heaney clamps were used to clamp beneath the cervix. The cervix and uterus were removed and sent to pathology. I then excised the oviducts.  The vaginal cuff was noted to be hemostatic.  All pedicles were noted to be hemostatic. The ureters were noted to be functioning and of normal caliber. The sponges were removed from the pelvis. The rectus muscles were inspected and hemostasis was assured. The fascia was closed with a 0 Vicryl running nonlocking suture. The subcutaneous tissue was irrigated, clean, dry.  A subcuticular closure was done with 3-0 vicryl suture. She tolerated the procedure well and was taken  to the recovery room in stable condition. Her Foley catheter drained clear urine throughout.

## 2018-08-12 NOTE — H&P (Signed)
Sarah Greene is an 32 y.o. female. Single G0 here for a TAH/BS. She has had excruciating pain for for 15 years. She goes to the ER and misses work frequently. She has tried IBU, OCPs. An u/s showed a fibroid. The uterine size and her nulliparous state preclude a vaginal approach. The pain is all month long, not just with her periods. The pain is even worse with her periods.   Menarche at 32 years old   Patient's last menstrual period was 08/06/2018.    Past Medical History:  Diagnosis Date  . Chronic cervical pain   . Chronic lower back pain   . Family history of adverse reaction to anesthesia    severe nausea and vomiting  . Migraines   . Pneumonia 2005   3 episodes  . PONV (postoperative nausea and vomiting)     Past Surgical History:  Procedure Laterality Date  . WISDOM TOOTH EXTRACTION      Family History  Problem Relation Age of Onset  . Hypertension Mother   . Arthritis Mother   . Hypertension Other   . Diabetes Other   . Asthma Brother     Social History:  reports that she has quit smoking. Her smoking use included cigarettes. She smoked 0.20 packs per day. She has never used smokeless tobacco. She reports that she drinks about 1.0 standard drinks of alcohol per week. She reports that she does not use drugs.  Allergies: No Known Allergies  Medications Prior to Admission  Medication Sig Dispense Refill Last Dose  . acetaminophen (TYLENOL) 500 MG tablet Take 500 mg by mouth every 6 (six) hours as needed.   Past Week at Unknown time  . LORazepam (ATIVAN) 1 MG tablet Take 1 tablet (1 mg total) by mouth every 8 (eight) hours as needed (severe dizziness). (Patient not taking: Reported on 07/23/2018) 15 tablet 0 Not Taking at Unknown time  . meclizine (ANTIVERT) 25 MG tablet Take 1 tablet (25 mg total) by mouth 3 (three) times daily as needed for dizziness. (Patient not taking: Reported on 07/23/2018) 30 tablet 0 Not Taking at Unknown time  . naproxen sodium (ALEVE) 220 MG  tablet Take 440 mg by mouth 2 (two) times daily as needed (cramps).   Unknown at Unknown time  . promethazine (PHENERGAN) 25 MG tablet Take 1 tablet (25 mg total) by mouth every 6 (six) hours as needed for nausea or vomiting. 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   Blood pressure (!) 140/93, pulse 68, temperature 98.1 F (36.7 C), temperature source Oral, resp. rate 16, height 5\' 8"  (1.727 m), weight 63.5 kg, last menstrual period 08/06/2018, SpO2 100 %. Physical Exam  Heart- rrr Lungs- CTAB Abd- benign  Results for orders placed or performed during the hospital encounter of 08/12/18 (from the past 24 hour(s))  Pregnancy, urine     Status: None   Collection Time: 08/12/18  6:00 AM  Result Value Ref Range   Preg Test, Ur NEGATIVE NEGATIVE    No results found.  Assessment/Plan: Symptomatic fibroids- plan for TAH/BS  She understands the risks of surgery, including, but not to infection, bleeding, DVTs, damage to bowel, bladder, ureters. She wishes to proceed.      Emily Filbert 08/12/2018, 7:16 AM

## 2018-08-13 ENCOUNTER — Encounter (HOSPITAL_COMMUNITY): Payer: Self-pay | Admitting: Obstetrics & Gynecology

## 2018-08-13 LAB — CBC
HCT: 30.6 % — ABNORMAL LOW (ref 36.0–46.0)
Hemoglobin: 10.5 g/dL — ABNORMAL LOW (ref 12.0–15.0)
MCH: 31.7 pg (ref 26.0–34.0)
MCHC: 34.3 g/dL (ref 30.0–36.0)
MCV: 92.4 fL (ref 78.0–100.0)
Platelets: 133 10*3/uL — ABNORMAL LOW (ref 150–400)
RBC: 3.31 MIL/uL — AB (ref 3.87–5.11)
RDW: 13.2 % (ref 11.5–15.5)
WBC: 7.6 10*3/uL (ref 4.0–10.5)

## 2018-08-13 MED ORDER — IBUPROFEN 800 MG PO TABS: 800.0000 mg | ORAL_TABLET | Freq: Three times a day (TID) | ORAL | 0 refills | Status: DC | PRN

## 2018-08-13 MED ORDER — OXYCODONE-ACETAMINOPHEN 5-325 MG PO TABS: 1.0000 | ORAL_TABLET | ORAL | 0 refills | Status: DC | PRN

## 2018-08-13 MED ORDER — OXYCODONE-ACETAMINOPHEN 5-325 MG PO TABS
1.0000 | ORAL_TABLET | ORAL | Status: DC | PRN
  Administered 2018-08-13 (×3): 1 via ORAL
  Filled 2018-08-13 (×3): qty 1

## 2018-08-13 NOTE — Discharge Summary (Signed)
Physician Discharge Summary  Patient ID: Natsha Guidry MRN: [id number] DOB/AGE: Sep 20, 1986 32 y.o.  Admit date: 08/12/2018 Discharge date: 08/13/2018  Admission Diagnoses: symptomatic fibroids  Discharge Diagnoses: same Active Problems:   Post-operative state   Discharged Condition: good  Hospital Course: She underwent an uncomplicated TAH/BS. By POD #1 she was voiding, ambulating, having flatus, tolerating food well, and voiced her readiness to go home.  Consults: None  Significant Diagnostic Studies: labs: post op hbg 10.5  Treatments: surgery: as above  Discharge Exam: Blood pressure 96/76, pulse 71, temperature 98.5 F (36.9 C), temperature source Oral, resp. rate 17, height 5\' 8"  (1.727 m), weight 63.5 kg, last menstrual period 08/06/2018, SpO2 100 %. General appearance: alert Resp: clear to auscultation bilaterally Cardio: regular rate and rhythm, S1, S2 normal, no murmur, click, rub or gallop GI: soft, non-tender; bowel sounds normal; no masses,  no organomegaly Incision/Wound: pressure and honeycomb dressings removed. Old blood on steristips, incision- intact, no erythema  Disposition:    Allergies as of 08/13/2018   No Known Allergies     Medication List    STOP taking these medications   acetaminophen 500 MG tablet Commonly known as:  TYLENOL   LORazepam 1 MG tablet Commonly known as:  ATIVAN   meclizine 25 MG tablet Commonly known as:  ANTIVERT   naproxen sodium 220 MG tablet Commonly known as:  ALEVE     TAKE these medications   ibuprofen 800 MG tablet Commonly known as:  ADVIL,MOTRIN Take 1 tablet (800 mg total) by mouth every 8 (eight) hours as needed (mild pain).   oxyCODONE-acetaminophen 5-325 MG tablet Commonly known as:  PERCOCET/ROXICET Take 1 tablet by mouth every 4 (four) hours as needed for moderate pain or severe pain.   promethazine 25 MG tablet Commonly known as:  PHENERGAN Take 1 tablet (25 mg total) by mouth every 6 (six)  hours as needed for nausea or vomiting.      Follow-up Information    Emily Filbert, MD. Schedule an appointment as soon as possible for a visit in 5 week(s).   Specialty:  Obstetrics and Gynecology Contact information: [place] [address] [phone]           Signed: Emily Filbert 08/13/2018, 1:11 PM

## 2018-08-20 ENCOUNTER — Telehealth: Payer: Self-pay | Admitting: *Deleted

## 2018-08-20 NOTE — Telephone Encounter (Signed)
Received a phone call from [place] at [facility] requesting clinicals for prior auth .

## 2018-08-22 NOTE — Telephone Encounter (Signed)
Phone number listed for [facility] is not valid. Called & clinicals need to be faxed to the clinical prior auth department at [phone] case ID: [id number]. Will have front office staff send.

## 2018-08-27 DIAGNOSIS — Z029 Encounter for administrative examinations, unspecified: Secondary | ICD-10-CM

## 2018-09-19 ENCOUNTER — Ambulatory Visit (INDEPENDENT_AMBULATORY_CARE_PROVIDER_SITE_OTHER): Payer: 59 | Admitting: Obstetrics & Gynecology

## 2018-09-19 ENCOUNTER — Encounter: Payer: Self-pay | Admitting: Obstetrics & Gynecology

## 2018-09-19 VITALS — BP 128/84 | HR 73 | Ht 68.0 in | Wt 132.4 lb

## 2018-09-19 DIAGNOSIS — Z9889 Other specified postprocedural states: Secondary | ICD-10-CM

## 2018-09-19 MED ORDER — METRONIDAZOLE 500 MG PO TABS: 500.0000 mg | ORAL_TABLET | Freq: Two times a day (BID) | ORAL | 0 refills | Status: DC

## 2018-09-19 NOTE — Progress Notes (Signed)
   Subjective:    Patient ID: Sarah Greene, female    DOB: 1986/02/13, 32 y.o.   MRN: [id number]  HPI  32 yo single G0 here for a post op visit. She had a TAH/BS on 08/12/18 for fibroids. She reports normal bowel and bladder functions. She some post prandial epigastric pain. She has not had sex.  She reports difficulty going to sleep. This is a new problem. She feels restless. She has tried lavender and chamomile tea.  Review of Systems She is a Engineer, manufacturing.    Objective:   Physical Exam Breathing, conversing, and ambulating normally Well nourished, well hydrated Black female, no apparent distress Abd- benign Cuff- healing well, some bloody discharge, smell c/w BV      Assessment & Plan:  Epigastric pain- rec Zantac BID Sleeping difficulty- check tsh, rec tylenol PM and melatonin If no better in 2 weeks come back BV- flagyl prescribed

## 2019-07-22 ENCOUNTER — Other Ambulatory Visit: Payer: Self-pay | Admitting: Family Medicine

## 2019-07-22 ENCOUNTER — Ambulatory Visit
Admission: RE | Admit: 2019-07-22 | Discharge: 2019-07-22 | Disposition: A | Discharge status: Home / Self Care | Payer: Worker's Compensation | Source: Ambulatory Visit | Attending: Family Medicine | Admitting: Family Medicine

## 2019-07-22 ENCOUNTER — Ambulatory Visit
Admission: RE | Admit: 2019-07-22 | Discharge: 2019-07-22 | Disposition: A | Discharge status: Home / Self Care | Payer: Worker's Compensation | Attending: Family Medicine | Admitting: Family Medicine

## 2019-07-22 ENCOUNTER — Other Ambulatory Visit: Payer: Self-pay

## 2019-07-22 DIAGNOSIS — T1490XA Injury, unspecified, initial encounter: Secondary | ICD-10-CM | POA: Insufficient documentation

## 2019-07-22 DIAGNOSIS — R52 Pain, unspecified: Secondary | ICD-10-CM | POA: Insufficient documentation

## 2020-01-24 ENCOUNTER — Telehealth: Payer: Self-pay | Admitting: Family

## 2020-01-24 DIAGNOSIS — H9209 Otalgia, unspecified ear: Secondary | ICD-10-CM

## 2020-01-24 DIAGNOSIS — R079 Chest pain, unspecified: Secondary | ICD-10-CM

## 2020-01-24 NOTE — Progress Notes (Signed)
Based on what you shared with me, I feel your condition warrants further evaluation and I recommend that you be seen for a face to face office visit.  Given your symptoms of chest pains, you need to be seen face to face to rule out more serious problems.    NOTE: If you entered your credit card information for this eVisit, you will not be charged. You may see a "hold" on your card for the $35 but that hold will drop off and you will not have a charge processed.   If you are having a true medical emergency please call 911.      For an urgent face to face visit, [facility] has five urgent care centers for your convenience:      NEW:  [facility] [place] [facility] [place] Get Driving Directions [id number] [place], [place] [address] . 10 am - 6pm Monday - Friday    [place] [facility]) Get Driving Directions [id number] [address] [place], [place] [address] . 10 am to 8 pm Monday-Friday . 12 pm to 8 pm [facility] at [facility] Get Driving Directions [id number] [place], [place], [place] [address] . 8 am to 8 pm Monday-Friday . 9 am to 6 pm Saturday . 11 am to 6 pm Sunday     [facility] at [facility] Get Driving Directions  [id number] [address], [place] [address] . 8 am to 8 pm Monday-Friday . 8 am to 4 pm [facility]gent [facility] at [place] Get Driving Directions [id number] [place], [place] [address] . 12 pm to 6 pm Monday-Friday      Your e-visit answers were reviewed by a board certified advanced clinical practitioner to complete your personal care plan.  Thank you for using e-Visits.

## 2020-02-06 ENCOUNTER — Encounter (HOSPITAL_COMMUNITY): Payer: Self-pay

## 2020-02-06 ENCOUNTER — Ambulatory Visit (HOSPITAL_COMMUNITY)
Admission: EM | Admit: 2020-02-06 | Discharge: 2020-02-06 | Disposition: A | Discharge status: Home / Self Care | Payer: 59 | Attending: Emergency Medicine | Admitting: Emergency Medicine

## 2020-02-06 ENCOUNTER — Other Ambulatory Visit: Payer: Self-pay

## 2020-02-06 ENCOUNTER — Ambulatory Visit (INDEPENDENT_AMBULATORY_CARE_PROVIDER_SITE_OTHER): Payer: 59

## 2020-02-06 DIAGNOSIS — R0602 Shortness of breath: Secondary | ICD-10-CM

## 2020-02-06 DIAGNOSIS — R0789 Other chest pain: Secondary | ICD-10-CM

## 2020-02-06 MED ORDER — PREDNISONE 20 MG PO TABS: 20.0000 mg | ORAL_TABLET | Freq: Two times a day (BID) | ORAL | 0 refills | Status: AC

## 2020-02-06 MED ORDER — AZITHROMYCIN 250 MG PO TABS: 250.0000 mg | ORAL_TABLET | Freq: Every day | ORAL | 0 refills | Status: DC

## 2020-02-06 MED ORDER — ALBUTEROL SULFATE HFA 108 (90 BASE) MCG/ACT IN AERS: 1.0000 | INHALATION_SPRAY | Freq: Four times a day (QID) | RESPIRATORY_TRACT | 0 refills | Status: DC | PRN

## 2020-02-06 NOTE — Discharge Instructions (Addendum)
EKG normal Chest xray without pneumonia I am treating you for bronchitis Begin azithromycin Prednisone twice daily with food Albuterol inhaler as needed for shortness of breath If symptoms continuing, developing worsening or changing chest pain, increased difficulty breathing, please follow up immediately

## 2020-02-06 NOTE — ED Triage Notes (Signed)
Pt present SOB and chest pain, symptoms been going on for two weeks. Pt states that she gets SOB when she walks up the stairs.

## 2020-02-06 NOTE — ED Provider Notes (Signed)
[facility]    CSN: [id number] Arrival date & time: 02/06/20  1100      History   Chief Complaint Chief Complaint  Patient presents with  . Chest Pain    HPI Sarah Greene is a 34 y.o. female history of migraines, tobacco use, presenting today for evaluation of chest pain.  Patient states that over the past 2 weeks she has had intermittent episodes of chest discomfort.  She mainly notices this in her lower ribs beneath her breasts, occasionally will be on the right, occasionally on the left.  Symptoms will last for approximately 5 minutes and then subside.  She does have associated sweating and feeling hot at the time of symptoms.  She has felt more short of breath recently, describes as both getting winded easily and slight tightness with deep inspiration.  She has had intermittent nausea.  Denies vomiting.  Denies any URI symptoms of congestion, rhinorrhea or sore throat.  Does report a cough over the past 2 weeks as well, has worsened of recently.  Denies fevers.  Smokes approximately 3 cigarettes/day.  Denies cough at baseline with tobacco use.  Denies personal history of hypertension, diabetes.  Denies family history of death from MI at early age.  Denies recent travel, immobilization surgery or hospitalization.  Denies estrogen use.  Denies leg pain or leg swelling.  Denies prior DVT/PE.  Denies associated dizziness or lightheadedness.  Patient completed two-step vaccine for Covid on 1/25.   HPI  Past Medical History:  Diagnosis Date  . Chronic cervical pain   . Chronic lower back pain   . Family history of adverse reaction to anesthesia    severe nausea and vomiting  . Migraines   . Pneumonia 2005   3 episodes  . PONV (postoperative nausea and vomiting)     Patient Active Problem List   Diagnosis Date Noted  . Post-operative state 08/12/2018  . Fibroids 05/19/2018    Past Surgical History:  Procedure Laterality Date  . HYSTERECTOMY ABDOMINAL WITH  SALPINGECTOMY Bilateral 08/12/2018   Procedure: HYSTERECTOMY ABDOMINAL WITH SALPINGECTOMY;  Surgeon: Emily Filbert, MD;  Location: [facility];  Service: Gynecology;  Laterality: Bilateral;  . WISDOM TOOTH EXTRACTION      OB History    Gravida  0   Para  0   Term  0   Preterm  0   AB  0   Living  0     SAB  0   TAB  0   Ectopic  0   Multiple  0   Live Births  0            Home Medications    Prior to Admission medications   Medication Sig Start Date End Date Taking? Authorizing Provider  albuterol (VENTOLIN HFA) 108 (90 Base) MCG/ACT inhaler Inhale 1-2 puffs into the lungs every 6 (six) hours as needed for wheezing or shortness of breath. 02/06/20   Deetya Drouillard C, PA-C  azithromycin (ZITHROMAX) 250 MG tablet Take 1 tablet (250 mg total) by mouth daily. Take first 2 tablets together, then 1 every day until finished. 02/06/20   Devanshi Califf C, PA-C  ibuprofen (ADVIL,MOTRIN) 800 MG tablet Take 1 tablet (800 mg total) by mouth every 8 (eight) hours as needed (mild pain). 08/13/18   Emily Filbert, MD  predniSONE (DELTASONE) 20 MG tablet Take 1 tablet (20 mg total) by mouth 2 (two) times daily with a meal for 4 days. 02/06/20 02/10/20  Debara Pickett  C, PA-C    Family History Family History  Problem Relation Age of Onset  . Hypertension Mother   . Arthritis Mother   . Hypertension Other   . Diabetes Other   . Asthma Brother     Social History Social History   Tobacco Use  . Smoking status: Former Smoker    Packs/day: 0.20    Types: Cigarettes  . Smokeless tobacco: Never Used  . Tobacco comment: quit 07/27/18  Substance Use Topics  . Alcohol use: Yes    Alcohol/week: 1.0 standard drinks    Types: 1 Cans of beer per week    Comment: 1 beer daily  . Drug use: No     Allergies   Patient has no known allergies.   Review of Systems Review of Systems  Constitutional: Negative for activity change, appetite change, chills, fatigue and fever.  HENT: Negative for  congestion, ear pain, rhinorrhea, sinus pressure, sore throat and trouble swallowing.   Eyes: Negative for discharge and redness.  Respiratory: Positive for cough and shortness of breath. Negative for chest tightness.   Cardiovascular: Positive for chest pain.  Gastrointestinal: Negative for abdominal pain, diarrhea, nausea and vomiting.  Musculoskeletal: Negative for myalgias.  Skin: Negative for rash.  Neurological: Negative for dizziness, light-headedness and headaches.     Physical Exam Triage Vital Signs ED Triage Vitals  Enc Vitals Group     BP 02/06/20 1216 118/77     Pulse Rate 02/06/20 1216 79     Resp 02/06/20 1216 16     Temp 02/06/20 1216 98.8 F (37.1 C)     Temp Source 02/06/20 1216 Oral     SpO2 02/06/20 1216 100 %     Weight --      Height --      Head Circumference --      Peak Flow --      Pain Score 02/06/20 1215 8     Pain Loc --      Pain Edu? --      Excl. in [place]? --    No data found.  Updated Vital Signs BP 118/77 (BP Location: Left Arm)   Pulse 79   Temp 98.8 F (37.1 C) (Oral)   Resp 16   LMP 08/06/2018   SpO2 100%   Visual Acuity Right Eye Distance:   Left Eye Distance:   Bilateral Distance:    Right Eye Near:   Left Eye Near:    Bilateral Near:     Physical Exam Vitals and nursing note reviewed.  Constitutional:      General: She is not in acute distress.    Appearance: She is well-developed.  HENT:     Head: Normocephalic and atraumatic.     Ears:     Comments: Bilateral ears without tenderness to palpation of external auricle, tragus and mastoid, EAC's without erythema or swelling, TM's with good bony landmarks and cone of light. Non erythematous.     Mouth/Throat:     Comments: Oral mucosa pink and moist, no tonsillar enlargement or exudate. Posterior pharynx patent and nonerythematous, no uvula deviation or swelling. Normal phonation. Eyes:     Conjunctiva/sclera: Conjunctivae normal.  Cardiovascular:     Rate and Rhythm:  Normal rate and regular rhythm.     Heart sounds: No murmur.  Pulmonary:     Effort: Pulmonary effort is normal. No respiratory distress.     Breath sounds: Normal breath sounds.     Comments: Breathing comfortably at  rest, CTABL, no wheezing, rales or other adventitious sounds auscultated   No anterior chest tenderness Diffusely tender in bilateral breast-patient states this is normal Abdominal:     Palpations: Abdomen is soft.     Tenderness: There is no abdominal tenderness.  Musculoskeletal:     Cervical back: Neck supple.     Comments: Bilateral lower leg symmetric, no calf swelling or tenderness  Skin:    General: Skin is warm and dry.  Neurological:     Mental Status: She is alert.      UC Treatments / Results  Labs (all labs ordered are listed, but only abnormal results are displayed) Labs Reviewed - No data to display  EKG   Radiology DG Chest 2 View  Result Date: 02/06/2020 CLINICAL DATA:  Intermittent chest pain.  Shortness of breath. EXAM: CHEST - 2 VIEW COMPARISON:  None. FINDINGS: The heart size and mediastinal contours are within normal limits. Both lungs are clear. The visualized skeletal structures are unremarkable. Negative for a pneumothorax. No pleural effusions. IMPRESSION: No active cardiopulmonary disease. Electronically Signed   By: Markus Daft M.D.   On: 02/06/2020 13:02    Procedures Procedures (including critical care time)  Medications Ordered in UC Medications - No data to display  Initial Impression / Assessment and Plan / UC Course  I have reviewed the triage vital signs and the nursing notes.  Pertinent labs & imaging results that were available during my care of the patient were reviewed by me and considered in my medical decision making (see chart for details).  Clinical Course as of Feb 05 1325  Sat Feb 06, 2020  1251 EKG normal sinus rhythm, no acute signs of ischemia or infarction, no arrhythmia or abnormal beats   [HW]     Clinical Course User Index [HW] Zenaida Tesar C, PA-C   Chest x-ray independently reviewed no signs of pneumonia or intrapulmonary abnormality noted at this time.  Less suspicious of ACS as cause of symptoms.  Vital signs stable without tachycardia or hypoxia, main risk factor for DVT/PE is tobacco use, but risk factors otherwise negative.  Will treat for bronchitis given associated cough and provide azithromycin, course of prednisone and albuterol inhaler.  Advised patient to continue to monitor chest discomfort and breathing, if symptoms progressing or worsening to follow-up here or emergency room, if developing recurrent similar chest discomfort may consider follow-up with cardiology for further evaluation.  Discussed strict return precautions. Patient verbalized understanding and is agreeable with plan.   Final Clinical Impressions(s) / UC Diagnoses   Final diagnoses:  Atypical chest pain  Shortness of breath     Discharge Instructions     EKG normal Chest xray without pneumonia I am treating you for bronchitis Begin azithromycin Prednisone twice daily with food Albuterol inhaler as needed for shortness of breath If symptoms continuing, developing worsening or changing chest pain, increased difficulty breathing, please follow up immediately    ED Prescriptions    Medication Sig Dispense Auth. Provider   albuterol (VENTOLIN HFA) 108 (90 Base) MCG/ACT inhaler Inhale 1-2 puffs into the lungs every 6 (six) hours as needed for wheezing or shortness of breath. 8 g Lucille Crichlow C, PA-C   azithromycin (ZITHROMAX) 250 MG tablet Take 1 tablet (250 mg total) by mouth daily. Take first 2 tablets together, then 1 every day until finished. 6 tablet Rashee Marschall C, PA-C   predniSONE (DELTASONE) 20 MG tablet Take 1 tablet (20 mg total) by mouth 2 (two) times  daily with a meal for 4 days. 8 tablet Jamyla Ard, [place] C, PA-C     PDMP not reviewed this encounter.   Joneen Caraway [place] C,  PA-C 02/06/20 1326

## 2020-07-26 ENCOUNTER — Encounter: Payer: Self-pay | Admitting: Family Medicine

## 2021-01-03 ENCOUNTER — Ambulatory Visit (HOSPITAL_COMMUNITY)
Admission: EM | Admit: 2021-01-03 | Discharge: 2021-01-03 | Disposition: A | Discharge status: Home / Self Care | Payer: 59 | Attending: Urgent Care | Admitting: Urgent Care

## 2021-01-03 ENCOUNTER — Encounter (HOSPITAL_COMMUNITY): Payer: Self-pay

## 2021-01-03 ENCOUNTER — Other Ambulatory Visit: Payer: Self-pay

## 2021-01-03 ENCOUNTER — Ambulatory Visit (HOSPITAL_COMMUNITY): Admit: 2021-01-03 | Disposition: A | Discharge status: Home / Self Care | Payer: Self-pay

## 2021-01-03 DIAGNOSIS — R112 Nausea with vomiting, unspecified: Secondary | ICD-10-CM | POA: Diagnosis not present

## 2021-01-03 DIAGNOSIS — S161XXA Strain of muscle, fascia and tendon at neck level, initial encounter: Secondary | ICD-10-CM

## 2021-01-03 DIAGNOSIS — F0781 Postconcussional syndrome: Secondary | ICD-10-CM

## 2021-01-03 DIAGNOSIS — R519 Headache, unspecified: Secondary | ICD-10-CM | POA: Diagnosis not present

## 2021-01-03 DIAGNOSIS — M542 Cervicalgia: Secondary | ICD-10-CM | POA: Diagnosis not present

## 2021-01-03 DIAGNOSIS — R42 Dizziness and giddiness: Secondary | ICD-10-CM

## 2021-01-03 MED ORDER — TIZANIDINE HCL 4 MG PO TABS: 4.0000 mg | ORAL_TABLET | Freq: Three times a day (TID) | ORAL | 0 refills | Status: DC | PRN

## 2021-01-03 MED ORDER — ONDANSETRON 8 MG PO TBDP: 8.0000 mg | ORAL_TABLET | Freq: Three times a day (TID) | ORAL | 0 refills | Status: DC | PRN
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
## 2021-01-03 NOTE — Discharge Instructions (Signed)
Please just use Tylenol at a dose of 500mg-650mg once every 6 hours as needed for your aches, pains, fevers. Do not use any nonsteroidal anti-inflammatories (NSAIDs) like ibuprofen, Motrin, naproxen, Aleve, etc. which are all available over-the-counter. Hydrate well with at least 2 liters (64 ounces) of water daily. 

## 2021-04-03 ENCOUNTER — Ambulatory Visit: Payer: 59 | Admitting: Nurse Practitioner

## 2021-04-07 ENCOUNTER — Telehealth: Payer: Self-pay | Admitting: Family Medicine

## 2021-04-07 NOTE — Telephone Encounter (Signed)
I called pt and left her a vm letting her know to give [place] a cb to schedule an appointment.      Sarah Greene  Mychart, Generic 2 hours ago (6:46 AM)      Not going to make today can I reschedule      Mychart, Generic  Sarah Greene, Sarah Greene 8 hours ago (12:44 AM)   GM   Appointment Information:     Visit Type: New Patient Visit         Date: 04/03/2021                 Dept: [facility]                 Provider: Wilfred Lacy                 Time: 1:30                 Length: 30 min   Appt Status: No Show        Sarah Greene 1 month ago   TV   Good afternoon,   Please give more details on what you will need to be seen for so that we can add it to the appointment notes. Sarah Greene likes to know specifically what the visits are for. Is it time for a Physical or do you have other concerns you would like to address? Please advise.  Thank you    This MyChart message has not been read.   Sarah Greene  Patient Appointment Schedule Request Pool 1 month ago      Appointment For: Sarah Greene ([id number]) Visit Type: NEW PATIENT [phone])  04/03/2021     1:30 PM  30 mins.  Sarah Greene, Sarah Brooke, NP   [facility]  Patient Comments: To address all my health concerns     Mychart, Generic  Sarah Greene, Sarah Greene 1 month ago   GM   Appointment Information:     Visit Type: New Patient Visit         Date: 04/03/2021                 Dept: [facility]                 Provider: Wilfred Lacy                 Time: 1:30 PM                 Length: 30 min   Appt Status: Scheduled

## 2021-07-23 ENCOUNTER — Other Ambulatory Visit: Payer: Self-pay

## 2021-07-23 ENCOUNTER — Emergency Department (HOSPITAL_COMMUNITY): Payer: 59

## 2021-07-23 ENCOUNTER — Encounter (HOSPITAL_COMMUNITY): Payer: Self-pay | Admitting: Oncology

## 2021-07-23 ENCOUNTER — Emergency Department (HOSPITAL_COMMUNITY)
Admission: EM | Admit: 2021-07-23 | Discharge: 2021-07-23 | Disposition: A | Discharge status: Home / Self Care | Payer: 59 | Attending: Emergency Medicine | Admitting: Emergency Medicine

## 2021-07-23 DIAGNOSIS — N83202 Unspecified ovarian cyst, left side: Secondary | ICD-10-CM | POA: Diagnosis not present

## 2021-07-23 DIAGNOSIS — R1032 Left lower quadrant pain: Secondary | ICD-10-CM

## 2021-07-23 DIAGNOSIS — Z87891 Personal history of nicotine dependence: Secondary | ICD-10-CM | POA: Diagnosis not present

## 2021-07-23 LAB — CBC WITH DIFFERENTIAL/PLATELET
Abs Immature Granulocytes: 0.01 10*3/uL (ref 0.00–0.07)
Basophils Absolute: 0 10*3/uL (ref 0.0–0.1)
Basophils Relative: 1 %
Eosinophils Absolute: 0.1 10*3/uL (ref 0.0–0.5)
Eosinophils Relative: 1 %
HCT: 38.7 % (ref 36.0–46.0)
Hemoglobin: 12.7 g/dL (ref 12.0–15.0)
Immature Granulocytes: 0 %
Lymphocytes Relative: 44 %
Lymphs Abs: 1.9 10*3/uL (ref 0.7–4.0)
MCH: 30.6 pg (ref 26.0–34.0)
MCHC: 32.8 g/dL (ref 30.0–36.0)
MCV: 93.3 fL (ref 80.0–100.0)
Monocytes Absolute: 0.5 10*3/uL (ref 0.1–1.0)
Monocytes Relative: 11 %
Neutro Abs: 1.8 10*3/uL (ref 1.7–7.7)
Neutrophils Relative %: 43 %
Platelets: 145 10*3/uL — ABNORMAL LOW (ref 150–400)
RBC: 4.15 MIL/uL (ref 3.87–5.11)
RDW: 12.8 % (ref 11.5–15.5)
WBC: 4.3 10*3/uL (ref 4.0–10.5)
nRBC: 0 % (ref 0.0–0.2)

## 2021-07-23 LAB — URINALYSIS, ROUTINE W REFLEX MICROSCOPIC
Bilirubin Urine: NEGATIVE
Glucose, UA: NEGATIVE mg/dL
Hgb urine dipstick: NEGATIVE
Ketones, ur: NEGATIVE mg/dL
Leukocytes,Ua: NEGATIVE
Nitrite: NEGATIVE
Protein, ur: NEGATIVE mg/dL
Specific Gravity, Urine: 1.017 (ref 1.005–1.030)
pH: 7 (ref 5.0–8.0)

## 2021-07-23 LAB — COMPREHENSIVE METABOLIC PANEL
ALT: 17 U/L (ref 0–44)
AST: 17 U/L (ref 15–41)
Albumin: 4.4 g/dL (ref 3.5–5.0)
Alkaline Phosphatase: 32 U/L — ABNORMAL LOW (ref 38–126)
Anion gap: 5 (ref 5–15)
BUN: 9 mg/dL (ref 6–20)
CO2: 26 mmol/L (ref 22–32)
Calcium: 9.2 mg/dL (ref 8.9–10.3)
Chloride: 108 mmol/L (ref 98–111)
Creatinine, Ser: 0.83 mg/dL (ref 0.44–1.00)
GFR, Estimated: >60 mL/min (ref >60)
Glucose, Bld: 89 mg/dL (ref 70–99)
Potassium: 4.3 mmol/L (ref 3.5–5.1)
Sodium: 139 mmol/L (ref 135–145)
Total Bilirubin: 0.8 mg/dL (ref 0.3–1.2)
Total Protein: 7.1 g/dL (ref 6.5–8.1)

## 2021-07-23 LAB — LIPASE, BLOOD: Lipase: 27 U/L (ref 11–51)

## 2021-07-23 MED ORDER — KETOROLAC TROMETHAMINE 15 MG/ML IJ SOLN
15.0000 mg | Freq: Once | INTRAMUSCULAR | Status: AC
  Administered 2021-07-23: 15 mg via INTRAVENOUS
  Filled 2021-07-23: qty 1

## 2021-07-23 MED ORDER — HYDROMORPHONE HCL 1 MG/ML IJ SOLN
1.0000 mg | Freq: Once | INTRAMUSCULAR | Status: AC
  Administered 2021-07-23: 1 mg via INTRAVENOUS
  Filled 2021-07-23: qty 1

## 2021-07-23 MED ORDER — LACTATED RINGERS IV BOLUS
1000.0000 mL | Freq: Once | INTRAVENOUS | Status: AC
  Administered 2021-07-23: 1000 mL via INTRAVENOUS

## 2021-07-23 MED ORDER — ONDANSETRON HCL 4 MG/2ML IJ SOLN
4.0000 mg | Freq: Four times a day (QID) | INTRAMUSCULAR | Status: DC | PRN
  Administered 2021-07-23: 4 mg via INTRAVENOUS
  Filled 2021-07-23: qty 2

## 2021-07-23 NOTE — ED Triage Notes (Addendum)
Charted in error.

## 2021-07-23 NOTE — Discharge Instructions (Signed)
Your pelvic ultrasound revealed an ovarian cyst is likely etiology of your pain and discomfort today.  No evidence of ovarian torsion which would have been an acute emergency.  Recommend continued management of your symptoms with ibuprofen at home.  Return to the emergency department in the event of persistent nausea, vomiting, severe abdominal pain and inability to tolerate oral intake.

## 2021-07-23 NOTE — ED Provider Notes (Signed)
Emergency Medicine Provider Triage Evaluation Note  Sarah Greene , a 35 y.o. female  was evaluated in triage.  Pt complains of left lower quadrant abdominal pain.  Pain started yesterday evening and has been constant since then.  Pain is worse with movement or touch.  No alleviating modalities tried.  Patient has history of hysterectomy.  Review of Systems  Positive: Abdominal pain Negative: Fever, chills, nausea, vomiting, constipation, diarrhea, hematuria, dysuria, urinary frequency, vaginal pain, vaginal bleeding, vaginal discharge  Physical Exam  BP (!) 130/91 (BP Location: Right Arm)   Pulse 73   Temp 98.2 F (36.8 C) (Oral)   Resp 16   LMP 08/06/2018   SpO2 100%  Gen:   Awake, no distress   Resp:  Normal effort  MSK:   Moves extremities without difficulty  Other:  Abdomen soft, nondistended, tenderness to left lower quadrant.  Medical Decision Making  Medically screening exam initiated at 10:32 AM.  Appropriate orders placed.  Trease Imbrogno was informed that the remainder of the evaluation will be completed by another provider, this initial triage assessment does not replace that evaluation, and the importance of remaining in the ED until their evaluation is complete.  The patient appears stable so that the remainder of the work up may be completed by another provider.      Loni Beckwith, PA-C 07/23/21 1033    Regan Lemming, MD 07/23/21 1133

## 2021-07-23 NOTE — ED Triage Notes (Signed)
Pt c/o LLQ abdominal pain that started last night.  Pt rates pain 9/10/

## 2021-07-23 NOTE — ED Provider Notes (Signed)
[facility] DEPT Provider Note   CSN: [id number] Arrival date & time: 07/23/21  0946     History Chief Complaint  Patient presents with   Abdominal Pain    Sarah Greene is a 35 y.o. female.   Abdominal Pain Pain location:  LLQ Pain quality: sharp and stabbing   Pain severity:  Moderate Timing:  Constant Progression:  Unchanged Chronicity:  New Worsened by:  Movement Ineffective treatments:  None tried Associated symptoms: flatus   Associated symptoms: no anorexia, no chest pain, no constipation, no dysuria, no fever, no nausea, no shortness of breath, no vaginal bleeding, no vaginal discharge and no vomiting    35 year old female with a history of chronic lower back pain, prior hysterectomy, presenting to the emergency department with left lower quadrant abdominal pain.  The patient states that the pain came on suddenly yesterday evening.  She describes it as sharp and severe, worse with movement or with touch.  She denies any anorexia, nausea or vomiting.  She denies any diarrhea or dysuria.  She denies any constipation.  She denies any fevers or chills.  She denies any vaginal pain, vaginal bleeding or discharge.  She believes that she had a hysterectomy but did not have any oophorectomy.    Past Medical History:  Diagnosis Date   Chronic cervical pain    Chronic lower back pain    Family history of adverse reaction to anesthesia    severe nausea and vomiting   Migraines    Pneumonia 2005   3 episodes   PONV (postoperative nausea and vomiting)     Patient Active Problem List   Diagnosis Date Noted   Post-operative state 08/12/2018   Fibroids 05/19/2018    Past Surgical History:  Procedure Laterality Date   HYSTERECTOMY ABDOMINAL WITH SALPINGECTOMY Bilateral 08/12/2018   Procedure: HYSTERECTOMY ABDOMINAL WITH SALPINGECTOMY;  Surgeon: Emily Filbert, MD;  Location: [facility];  Service: Gynecology;  Laterality: Bilateral;   WISDOM TOOTH  EXTRACTION       OB History     Gravida  0   Para  0   Term  0   Preterm  0   AB  0   Living  0      SAB  0   IAB  0   Ectopic  0   Multiple  0   Live Births  0           Family History  Problem Relation Age of Onset   Hypertension Mother    Arthritis Mother    Hypertension Other    Diabetes Other    Asthma Brother     Social History   Tobacco Use   Smoking status: Former    Packs/day: 0.20    Types: Cigarettes   Smokeless tobacco: Never   Tobacco comments:    quit 07/27/18  Vaping Use   Vaping Use: Never used  Substance Use Topics   Alcohol use: Yes    Alcohol/week: 1.0 standard drink    Types: 1 Cans of beer per week    Comment: 1 beer daily   Drug use: No    Home Medications Prior to Admission medications   Medication Sig Start Date End Date Taking? Authorizing Provider  albuterol (VENTOLIN HFA) 108 (90 Base) MCG/ACT inhaler Inhale 1-2 puffs into the lungs every 6 (six) hours as needed for wheezing or shortness of breath. 02/06/20  Yes Wieters, Hallie C, PA-C  Multiple Vitamins-Minerals (MULTIVITAMIN GUMMIES ADULT) CHEW  Chew 2 tablets by mouth daily.   Yes [provider]  azithromycin (ZITHROMAX) 250 MG tablet Take 1 tablet (250 mg total) by mouth daily. Take first 2 tablets together, then 1 every day until finished. Patient not taking: Reported on 07/23/2021 02/06/20   Wieters, Hallie C, PA-C  ibuprofen (ADVIL,MOTRIN) 800 MG tablet Take 1 tablet (800 mg total) by mouth every 8 (eight) hours as needed (mild pain). Patient not taking: Reported on 07/23/2021 08/13/18   Emily Filbert, MD  ondansetron (ZOFRAN-ODT) 8 MG disintegrating tablet Take 1 tablet (8 mg total) by mouth every 8 (eight) hours as needed for nausea or vomiting. Patient not taking: Reported on 07/23/2021 01/03/21   Jaynee Eagles, PA-C  tiZANidine (ZANAFLEX) 4 MG tablet Take 1 tablet (4 mg total) by mouth every 8 (eight) hours as needed. Patient not taking: Reported on 07/23/2021  01/03/21   Jaynee Eagles, PA-C  tiZANidine (ZANAFLEX) 4 MG tablet Take 1 tablet (4 mg total) by mouth every 8 (eight) hours as needed. Patient not taking: Reported on 07/23/2021 01/03/21   Jaynee Eagles, PA-C    Allergies    Patient has no known allergies.  Review of Systems   Review of Systems  Constitutional:  Negative for fever.  Respiratory:  Negative for shortness of breath.   Cardiovascular:  Negative for chest pain.  Gastrointestinal:  Positive for abdominal pain and flatus. Negative for anorexia, constipation, nausea and vomiting.  Genitourinary:  Positive for pelvic pain. Negative for dysuria, vaginal bleeding and vaginal discharge.  All other systems reviewed and are negative.  Physical Exam Updated Vital Signs BP 128/85   Pulse (!) 58   Temp 98.2 F (36.8 C) (Oral)   Resp 18   Ht '5\' 9"'$  (1.753 m)   Wt 65.8 kg   LMP 08/06/2018   SpO2 100%   BMI 21.41 kg/m   Physical Exam Vitals and nursing note reviewed.  Constitutional:      General: She is not in acute distress. HENT:     Head: Normocephalic and atraumatic.  Eyes:     Conjunctiva/sclera: Conjunctivae normal.     Pupils: Pupils are equal, round, and reactive to light.  Cardiovascular:     Rate and Rhythm: Normal rate and regular rhythm.  Pulmonary:     Effort: Pulmonary effort is normal. No respiratory distress.  Abdominal:     General: There is no distension.     Tenderness: There is abdominal tenderness in the left lower quadrant. There is guarding.  Musculoskeletal:        General: No deformity or signs of injury.     Cervical back: Normal range of motion and neck supple.  Skin:    Findings: No lesion or rash.  Neurological:     General: No focal deficit present.     Mental Status: She is alert. Mental status is at baseline.    ED Results / Procedures / Treatments   Labs (all labs ordered are listed, but only abnormal results are displayed) Labs Reviewed  CBC WITH DIFFERENTIAL/PLATELET - Abnormal;  Notable for the following components:      Result Value   Platelets 145 (*)    All other components within normal limits  COMPREHENSIVE METABOLIC PANEL - Abnormal; Notable for the following components:   Alkaline Phosphatase 32 (*)    All other components within normal limits  LIPASE, BLOOD  URINALYSIS, ROUTINE W REFLEX MICROSCOPIC    EKG None  Radiology US PELVIC DOPPLER (TORSION R/O OR  MASS ARTERIAL FLOW)  Result Date: 07/23/2021 CLINICAL DATA:  Left lower quadrant pain since last night. History of a hysterectomy. EXAM: DOPPLER ULTRASOUND OF OVARIES TECHNIQUE: Color and duplex Doppler ultrasound was utilized to evaluate blood flow to the ovaries. COMPARISON:  10/04/2017. FINDINGS: Right ovary: 2.6 x 1.4 x 1.5 cm, volume 2.7 mL. Normal appearance. No adnexal masses. Left ovary: 3.4 x 2.6 x 3.1 cm, volume 14.6 mL. Septated cyst measuring 2.6 x 2.1 x 2.4 cm. Ovary otherwise unremarkable. No adnexal masses. Pulsed Doppler evaluation of both ovaries demonstrates normal low-resistance arterial and venous waveforms. IMPRESSION: 1. No evidence of ovarian torsion. Normal arterial and venous blood flow to both ovaries. 2. Septated cyst the left ovary, 2.6 cm. This is almost certainly benign/physiologic with no follow-up recommended. Electronically Signed   By: Lajean Manes M.D.   On: 07/23/2021 15:04    Procedures Procedures   Medications Ordered in ED Medications  ondansetron (ZOFRAN) injection 4 mg (4 mg Intravenous Given 07/23/21 1311)  ketorolac (TORADOL) 15 MG/ML injection 15 mg (has no administration in time range)  HYDROmorphone (DILAUDID) injection 1 mg (1 mg Intravenous Given 07/23/21 1311)  lactated ringers bolus 1,000 mL (1,000 mLs Intravenous New Bag/Given 07/23/21 1312)    ED Course  I have reviewed the triage vital signs and the nursing notes.  Pertinent labs & imaging results that were available during my care of the patient were reviewed by me and considered in my medical  decision making (see chart for details).    MDM Rules/Calculators/A&P                           35 year old female presenting to the emergency department with roughly 24 hours of left lower quadrant sharp stabbing abdominal pain.  On arrival, patient was afebrile, hemodynamically stable, with physical exam concerning for left lower quadrant tenderness palpation with mild guarding present.  The patient's status post hysterectomy.  She is passing gas.  She is tolerating oral intake with no nausea or vomiting.  Differential diagnosis includes ovarian torsion, ruptured ovarian cyst.  Based on history and physical exam, lower suspicion for appendicitis, tubo-ovarian abscess, diverticulitis, small bowel obstruction.  Obtain pelvic ultrasound, screening labs and reassess.  Labs in the emergency department due to include urinalysis negative for urinary tract infection, lipase normal, CBC without a leukocytosis, anemia, mild thrombocytopenia to 145 but appears to be at baseline for the patient.  CMP grossly unremarkable.  Pelvic ultrasound was performed which revealed no evidence of ovarian torsion with normal arterial and venous blood flow to both ovaries.  A septated cyst of left ovary measuring 2.6 cm was found to be likely benign/physiologic with no follow-up recommended.  This is likely etiology of the patient's discomfort.  The patient has been appropriately medically screened and/or stabilized in the ED. I have low suspicion for any other emergent medical condition which would require further screening, evaluation or treatment in the ED or require inpatient management.  On repeat evaluation, the patient's abdomen was soft, nontender, nondistended.  She was tolerating oral intake.  She is passing gas.  Recommended ibuprofen for pain control and continued management outpatient.  Final Clinical Impression(s) / ED Diagnoses Final diagnoses:  LLQ pain  Cyst of left ovary    Rx / DC Orders ED  Discharge Orders     None        Regan Lemming, MD 07/23/21 1520

## 2021-12-31 ENCOUNTER — Ambulatory Visit (HOSPITAL_COMMUNITY): Payer: 59

## 2022-02-26 ENCOUNTER — Telehealth: Payer: 59 | Admitting: Family

## 2022-02-26 DIAGNOSIS — J069 Acute upper respiratory infection, unspecified: Secondary | ICD-10-CM | POA: Diagnosis not present

## 2022-02-26 DIAGNOSIS — J029 Acute pharyngitis, unspecified: Secondary | ICD-10-CM | POA: Diagnosis not present

## 2022-02-26 MED ORDER — FLUTICASONE PROPIONATE 50 MCG/ACT NA SUSP
2.0000 | Freq: Every day | NASAL | 6 refills | Status: DC
Start: 2022-02-26 — End: 2023-04-21

## 2022-02-26 MED ORDER — CETIRIZINE HCL 10 MG PO TABS: 10.0000 mg | ORAL_TABLET | Freq: Every day | ORAL | 11 refills | Status: DC

## 2022-02-26 NOTE — Progress Notes (Signed)
?Virtual Visit Consent  ? ?Sarah Greene, you are scheduled for a virtual visit with a [facility] provider today.   ?  ?Just as with appointments in the office, your consent must be obtained to participate.  Your consent will be active for this visit and any virtual visit you may have with one of our providers in the next 365 days.   ?  ?If you have a MyChart account, a copy of this consent can be sent to you electronically.  All virtual visits are billed to your insurance company just like a traditional visit in the office.   ? ?As this is a virtual visit, video technology does not allow for your provider to perform a traditional examination.  This may limit your provider's ability to fully assess your condition.  If your provider identifies any concerns that need to be evaluated in person or the need to arrange testing (such as labs, EKG, etc.), we will make arrangements to do so.   ?  ?Although advances in technology are sophisticated, we cannot ensure that it will always work on either your end or our end.  If the connection with a video visit is poor, the visit may have to be switched to a telephone visit.  With either a video or telephone visit, we are not always able to ensure that we have a secure connection.    ? ?I need to obtain your verbal consent now.   Are you willing to proceed with your visit today?  ?  ?Sarah Greene has provided verbal consent on 02/26/2022 for a virtual visit (video or telephone). ?  ?Evelina Dun, FNP  ? ?Date: 02/26/2022 4:44 PM ? ? ?Virtual Visit via Video Note  ? ?IEvelina Dun, connected with  Sarah Greene  ([id number], [id number]) on 02/26/22 at  4:30 PM EDT by a video-enabled telemedicine application and verified that I am speaking with the correct person using two identifiers. ? ?Location: ?Patient: Virtual Visit Location Patient: Other: work ?Provider: Virtual Visit Location Provider: Home Office ?  ?I discussed the limitations of evaluation and management by  telemedicine and the availability of in person appointments. The patient expressed understanding and agreed to proceed.   ? ?History of Present Illness: ?Sarah Greene is a 36 y.o. who identifies as a female who was assigned female at birth, and is being seen today for sore throat. ? ?HPI: Sore Throat  ?This is a new problem. The current episode started in the past 7 days. The problem has been gradually worsening. Maximum temperature: 99. The pain is at a severity of 6/10. The pain is moderate. Associated symptoms include coughing (dry), headaches, a hoarse voice, shortness of breath, swollen glands and trouble swallowing. Pertinent negatives include no congestion or ear pain. Associated symptoms comments: Tonsils mildly erythemas. She has tried acetaminophen and NSAIDs for the symptoms. The treatment provided mild relief.   ?Problems:  ?Patient Active Problem List  ? Diagnosis Date Noted  ? Post-operative state 08/12/2018  ? Fibroids 05/19/2018  ?  ?Allergies: No Known Allergies ?Medications:  ?Current Outpatient Medications:  ?  cetirizine (ZYRTEC) 10 MG tablet, Take 1 tablet (10 mg total) by mouth daily., Disp: 30 tablet, Rfl: 11 ?  fluticasone (FLONASE) 50 MCG/ACT nasal spray, Place 2 sprays into both nostrils daily., Disp: 16 g, Rfl: 6 ?  albuterol (VENTOLIN HFA) 108 (90 Base) MCG/ACT inhaler, Inhale 1-2 puffs into the lungs every 6 (six) hours as needed for wheezing or shortness of breath., Disp: 8  g, Rfl: 0 ?  Multiple Vitamins-Minerals (MULTIVITAMIN GUMMIES ADULT) CHEW, Chew 2 tablets by mouth daily., Disp: , Rfl:  ? ?Observations/Objective: ?Patient is well-developed, well-nourished in no acute distress.   ?Head is normocephalic, atraumatic.  ?No labored breathing.  ?Speech is clear and coherent with logical content.  ?Patient is alert and oriented at baseline.  ?Tonsils mildly erythemas  ? ?Assessment and Plan: ?1. Viral URI ?- cetirizine (ZYRTEC) 10 MG tablet; Take 1 tablet (10 mg total) by mouth daily.   Dispense: 30 tablet; Refill: 11 ?- fluticasone (FLONASE) 50 MCG/ACT nasal spray; Place 2 sprays into both nostrils daily.  Dispense: 16 g; Refill: 6 ? ?2. Acute pharyngitis, unspecified etiology ?- cetirizine (ZYRTEC) 10 MG tablet; Take 1 tablet (10 mg total) by mouth daily.  Dispense: 30 tablet; Refill: 11 ?- fluticasone (FLONASE) 50 MCG/ACT nasal spray; Place 2 sprays into both nostrils daily.  Dispense: 16 g; Refill: 6 ? ?- Take meds as prescribed ?- Use a cool mist humidifier  ?-Use saline nose sprays frequently ?-Force fluids ?-For any cough or congestion ? Use plain Mucinex- regular strength or max strength is fine ?-For fever or aces or pains- take tylenol or ibuprofen. ?-Throat lozenges if help ?-New toothbrush in 3 days ? ?Follow Up Instructions: ?I discussed the assessment and treatment plan with the patient. The patient was provided an opportunity to ask questions and all were answered. The patient agreed with the plan and demonstrated an understanding of the instructions.  A copy of instructions were sent to the patient via MyChart unless otherwise noted below.  ? ? ?The patient was advised to call back or seek an in-person evaluation if the symptoms worsen or if the condition fails to improve as anticipated. ? ?Time:  ?I spent 14 minutes with the patient via telehealth technology discussing the above problems/concerns.   ? ?Evelina Dun, FNP ? ?

## 2022-07-05 ENCOUNTER — Emergency Department: Payer: 59

## 2022-07-05 ENCOUNTER — Other Ambulatory Visit: Payer: Self-pay

## 2022-07-05 ENCOUNTER — Emergency Department
Admission: EM | Admit: 2022-07-05 | Discharge: 2022-07-05 | Disposition: A | Discharge status: Home / Self Care | Payer: 59 | Attending: Emergency Medicine | Admitting: Emergency Medicine

## 2022-07-05 DIAGNOSIS — R0789 Other chest pain: Secondary | ICD-10-CM | POA: Insufficient documentation

## 2022-07-05 DIAGNOSIS — R110 Nausea: Secondary | ICD-10-CM | POA: Diagnosis not present

## 2022-07-05 DIAGNOSIS — R079 Chest pain, unspecified: Secondary | ICD-10-CM | POA: Diagnosis present

## 2022-07-05 LAB — CBC
HCT: 37.6 % (ref 36.0–46.0)
Hemoglobin: 12.5 g/dL (ref 12.0–15.0)
MCH: 30.9 pg (ref 26.0–34.0)
MCHC: 33.2 g/dL (ref 30.0–36.0)
MCV: 92.8 fL (ref 80.0–100.0)
Platelets: 147 10*3/uL — ABNORMAL LOW (ref 150–400)
RBC: 4.05 MIL/uL (ref 3.87–5.11)
RDW: 12.7 % (ref 11.5–15.5)
WBC: 5 10*3/uL (ref 4.0–10.5)
nRBC: 0 % (ref 0.0–0.2)

## 2022-07-05 LAB — BASIC METABOLIC PANEL
Anion gap: 6 (ref 5–15)
BUN: 9 mg/dL (ref 6–20)
CO2: 25 mmol/L (ref 22–32)
Calcium: 9.1 mg/dL (ref 8.9–10.3)
Chloride: 107 mmol/L (ref 98–111)
Creatinine, Ser: 0.88 mg/dL (ref 0.44–1.00)
GFR, Estimated: >60 mL/min (ref >60)
Glucose, Bld: 106 mg/dL — ABNORMAL HIGH (ref 70–99)
Potassium: 3.8 mmol/L (ref 3.5–5.1)
Sodium: 138 mmol/L (ref 135–145)

## 2022-07-05 LAB — TROPONIN I (HIGH SENSITIVITY): Troponin I (High Sensitivity): <2 ng/L (ref <18)

## 2022-07-05 MED ORDER — CYCLOBENZAPRINE HCL 5 MG PO TABS: 5.0000 mg | ORAL_TABLET | Freq: Three times a day (TID) | ORAL | 0 refills | Status: DC | PRN

## 2022-07-05 NOTE — ED Provider Notes (Signed)
[facility] Emergency Department Provider Note     Event Date/Time   First MD Initiated Contact with Patient 07/05/22 1510     (approximate)   History   Chest Pain and Nausea   HPI  Sarah Greene is a 36 y.o. female with a history of migraines, low back pain, and fibroids, presents to the ED with intermittent left-sided chest pain with nausea for "a while."  Patient reports his significant increase in symptoms yesterday after she was playing basketball with the kids.  She reports the pain at a 5 out of 10 and describes the pain as sharp in nature.  She denies any associated shortness of breath, vomiting, cough, or hemoptysis.   Physical Exam   Triage Vital Signs: ED Triage Vitals  Enc Vitals Group     BP 07/05/22 1414 115/84     Pulse Rate 07/05/22 1414 99     Resp 07/05/22 1414 15     Temp 07/05/22 1414 98.5 F (36.9 C)     Temp Source 07/05/22 1414 Oral     SpO2 07/05/22 1414 100 %     Weight 07/05/22 1414 146 lb (66.2 kg)     Height 07/05/22 1414 '5\' 8"'$  (1.727 m)     Head Circumference --      Peak Flow --      Pain Score 07/05/22 1422 5     Pain Loc --      Pain Edu? --      Excl. in [place]? --     Most recent vital signs: Vitals:   07/05/22 1414  BP: 115/84  Pulse: 99  Resp: 15  Temp: 98.5 F (36.9 C)  SpO2: 100%    General Awake, no distress.  CV:  Good peripheral perfusion.  RESP:  Normal effort.  ABD:  No distention.     ED Results / Procedures / Treatments   Labs (all labs ordered are listed, but only abnormal results are displayed) Labs Reviewed  BASIC METABOLIC PANEL - Abnormal; Notable for the following components:      Result Value   Glucose, Bld 106 (*)    All other components within normal limits  CBC - Abnormal; Notable for the following components:   Platelets 147 (*)    All other components within normal limits  POC URINE PREG, ED  TROPONIN I (HIGH SENSITIVITY)  TROPONIN I (HIGH SENSITIVITY)      EKG  Vent. rate 84 BPM PR interval 148 ms QRS duration 72 ms QT/QTcB 376/444 ms P-R-T axes 85 61 48 No STEMI  RADIOLOGY  I personally viewed and evaluated these images as part of my medical decision making, as well as reviewing the written report by the radiologist.  ED Provider Interpretation: no acute findings}  DG Chest 2 View  Result Date: 07/05/2022 CLINICAL DATA:  Chest pain EXAM: CHEST - 2 VIEW COMPARISON:  02/06/2020 FINDINGS: The cardiac silhouette, mediastinal and hilar contours are normal. The lungs are clear. No pleural effusion. No pulmonary lesions. The bony thorax is intact. IMPRESSION: No acute cardiopulmonary findings. Electronically Signed   By: Marijo Sanes M.D.   On: 07/05/2022 14:49     PROCEDURES:  Critical Care performed: No  Procedures   MEDICATIONS ORDERED IN ED: Medications - No data to display   IMPRESSION / MDM / [place] / ED COURSE  I reviewed the triage vital signs and the nursing notes.  Differential diagnosis includes, but is not limited to, ACS, aortic dissection, pulmonary embolism, cardiac tamponade, pneumothorax, pneumonia, pericarditis, myocarditis, GI-related causes including esophagitis/gastritis, and musculoskeletal chest wall pain.     Patient's presentation is most consistent with acute complicated illness / injury requiring diagnostic workup.  Patient's diagnosis is consistent with nonspecific chest pain, likely musculoskeletal in nature.  With overall reassuring work-up at this time.  She presents for evaluation of acute on chronic intermittent chest wall pain.  No red flags on exam.  Patient remained stable throughout her course with normal vital signs.  No radiologic evidence of any acute intrathoracic process.  EKG without evidence of malignant arrhythmia or STEMI.  Troponin negative and no other electrolyte abnormalities on routine labs.  She denies any current chest pain throughout  her ED evaluation. Patient will be discharged home with prescriptions for Flexeril. Patient is to follow up with her primary provider or cardiology as needed or otherwise directed. Patient is given ED precautions to return to the ED for any worsening or new symptoms.     FINAL CLINICAL IMPRESSION(S) / ED DIAGNOSES   Final diagnoses:  Atypical chest pain     Rx / DC Orders   ED Discharge Orders          Ordered    cyclobenzaprine (FLEXERIL) 5 MG tablet  3 times daily PRN        07/05/22 1712             Note:  This document was prepared using Dragon voice recognition software and may include unintentional dictation errors.    Melvenia Needles, PA-C 07/05/22 1712    Carrie Mew, MD 07/05/22 1840

## 2022-07-05 NOTE — ED Triage Notes (Signed)
Pt c/o intermittent sharp L side chest pain and nausea x "a while" but worsening w/ activity yesterday.  Pain score 5/10.  Pt reports pain has increased since playing basketball w/ kids yesterday.  Hx of smoking.

## 2022-07-05 NOTE — Discharge Instructions (Addendum)
Your exam, EKG, chest ray, and labs all normal and reassuring at this time.  No signs of a serious underlying cause for your intermittent chest pain.  You should follow-up with your primary provider and/or cardiology for ongoing symptoms.  Return to the ED if needed.

## 2022-07-28 ENCOUNTER — Emergency Department (HOSPITAL_COMMUNITY)
Admission: EM | Admit: 2022-07-28 | Discharge: 2022-07-29 | Discharge status: Against Medical Advice | Payer: 59 | Attending: Student | Admitting: Student

## 2022-07-28 ENCOUNTER — Encounter (HOSPITAL_COMMUNITY): Payer: Self-pay

## 2022-07-28 ENCOUNTER — Other Ambulatory Visit: Payer: Self-pay

## 2022-07-28 DIAGNOSIS — Z5321 Procedure and treatment not carried out due to patient leaving prior to being seen by health care provider: Secondary | ICD-10-CM | POA: Diagnosis not present

## 2022-07-28 DIAGNOSIS — T63001A Toxic effect of unspecified snake venom, accidental (unintentional), initial encounter: Secondary | ICD-10-CM | POA: Insufficient documentation

## 2022-07-28 LAB — CBC WITH DIFFERENTIAL/PLATELET
Abs Immature Granulocytes: 0.03 10*3/uL (ref 0.00–0.07)
Basophils Absolute: 0 10*3/uL (ref 0.0–0.1)
Basophils Relative: 0 %
Eosinophils Absolute: 0 10*3/uL (ref 0.0–0.5)
Eosinophils Relative: 0 %
HCT: 36.5 % (ref 36.0–46.0)
Hemoglobin: 12.2 g/dL (ref 12.0–15.0)
Immature Granulocytes: 1 %
Lymphocytes Relative: 23 %
Lymphs Abs: 1.5 10*3/uL (ref 0.7–4.0)
MCH: 30.9 pg (ref 26.0–34.0)
MCHC: 33.4 g/dL (ref 30.0–36.0)
MCV: 92.4 fL (ref 80.0–100.0)
Monocytes Absolute: 0.6 10*3/uL (ref 0.1–1.0)
Monocytes Relative: 9 %
Neutro Abs: 4.4 10*3/uL (ref 1.7–7.7)
Neutrophils Relative %: 67 %
Platelets: 162 10*3/uL (ref 150–400)
RBC: 3.95 MIL/uL (ref 3.87–5.11)
RDW: 12.7 % (ref 11.5–15.5)
WBC: 6.5 10*3/uL (ref 4.0–10.5)
nRBC: 0 % (ref 0.0–0.2)

## 2022-07-28 LAB — BASIC METABOLIC PANEL
Anion gap: 7 (ref 5–15)
BUN: 9 mg/dL (ref 6–20)
CO2: 24 mmol/L (ref 22–32)
Calcium: 8.9 mg/dL (ref 8.9–10.3)
Chloride: 107 mmol/L (ref 98–111)
Creatinine, Ser: 0.8 mg/dL (ref 0.44–1.00)
GFR, Estimated: >60 mL/min (ref >60)
Glucose, Bld: 98 mg/dL (ref 70–99)
Potassium: 3.8 mmol/L (ref 3.5–5.1)
Sodium: 138 mmol/L (ref 135–145)

## 2022-07-28 LAB — PROTIME-INR
INR: 1 (ref 0.8–1.2)
Prothrombin Time: 13.3 seconds (ref 11.4–15.2)

## 2022-07-28 LAB — FIBRINOGEN: Fibrinogen: 305 mg/dL (ref 210–475)

## 2022-07-28 NOTE — ED Provider Triage Note (Signed)
Emergency Medicine Provider Triage Evaluation Note  Sarah Greene , a 36 y.o. female  was evaluated in triage.  Pt complains of snakebite to right arm while gardening. Admits to some swelling to right arm. Happened around 7PM  Review of Systems  Positive: edema Negative: fever  Physical Exam  BP 129/80   Pulse 94   Temp 98.3 F (36.8 C)   Resp 19   Ht '5\' 8"'$  (1.727 m)   Wt 61.2 kg   LMP 08/06/2018   SpO2 100%   BMI 20.53 kg/m  Gen:   Awake, no distress   Resp:  Normal effort  MSK:   Moves extremities without difficulty  Other:    Medical Decision Making  Medically screening exam initiated at 9:47 PM.  Appropriate orders placed.  Sarah Greene was informed that the remainder of the evaluation will be completed by another provider, this initial triage assessment does not replace that evaluation, and the importance of remaining in the ED until their evaluation is complete.  labs   Karie Kirks 07/28/22 2147

## 2022-07-28 NOTE — ED Triage Notes (Signed)
Ambulatory to ED with c/o snake bite to R arm while gardening. Unsure exactly where she was bit, but c/o R hand and wrist pain with associated swelling.   Unsure of type of snake - states it was small, looked like a worm, and was brown with a white belly.

## 2022-10-16 ENCOUNTER — Ambulatory Visit: Payer: 59 | Admitting: Nurse Practitioner

## 2022-10-26 IMAGING — US US ART/VEN ABD/PELV/SCROTUM DOPPLER LTD
1 series · 14 of 25 positions shown · non-contrast
Comparison: 10/04/2017.

CLINICAL DATA: Left lower quadrant pain since last night. History
of a hysterectomy.

EXAM:
DOPPLER ULTRASOUND OF OVARIES
TECHNIQUE: Color and duplex Doppler ultrasound was utilized to evaluate blood
flow to the ovaries.

[Series 1: us art/ven abd/pelv/scrotum doppler ltd · 88 acquisitions, 14 frames shown]
[im 1/88]
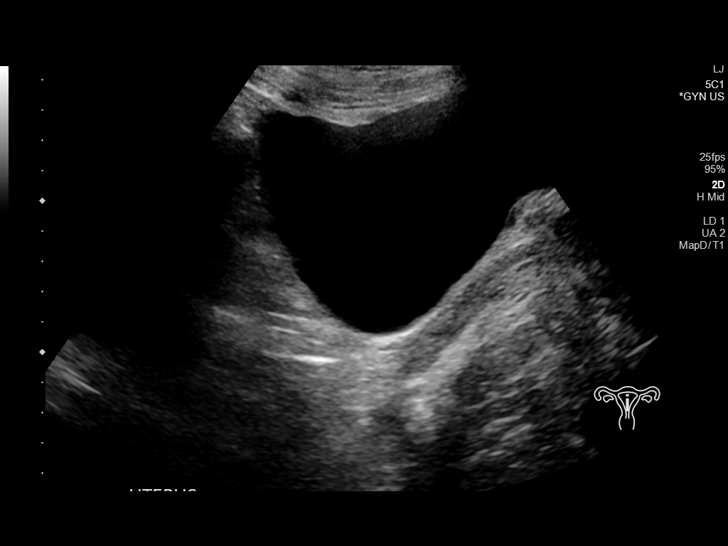
[im 8/88]
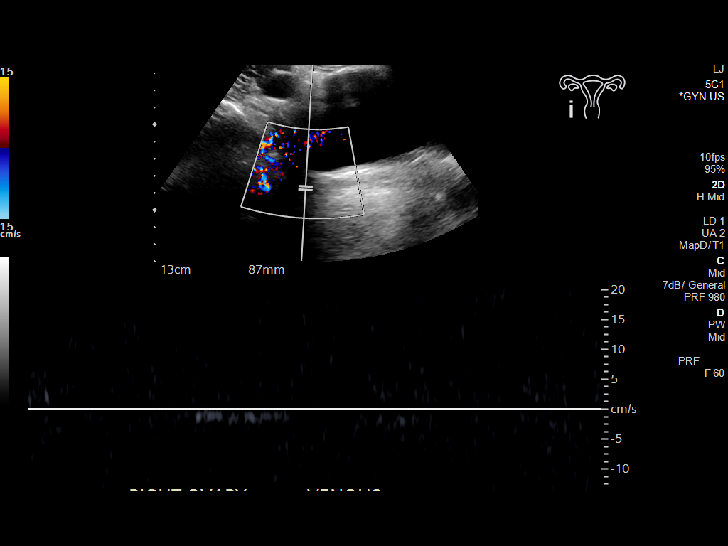
[im 15/88]
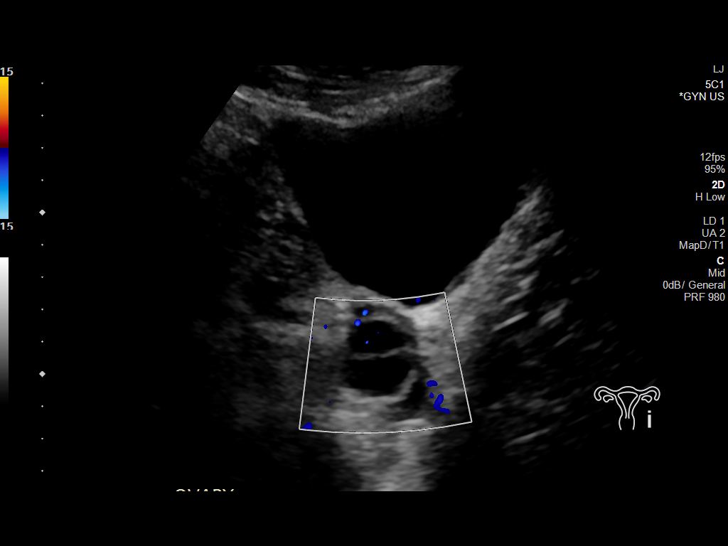
[im 22/88]
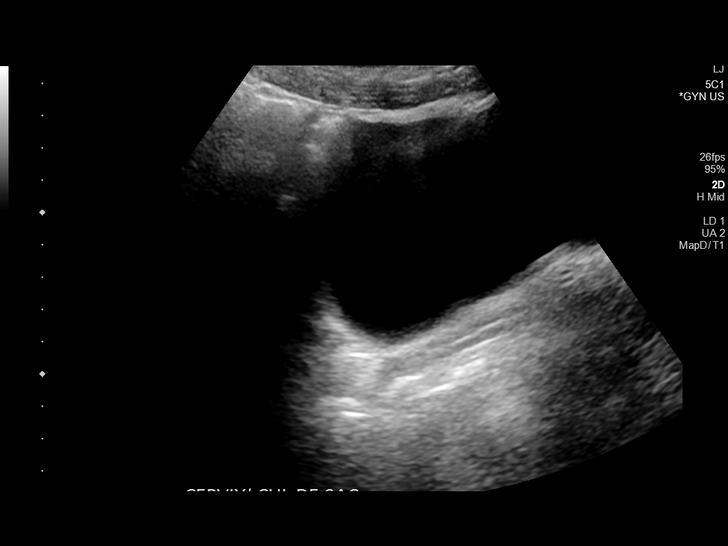
[im 30/88]
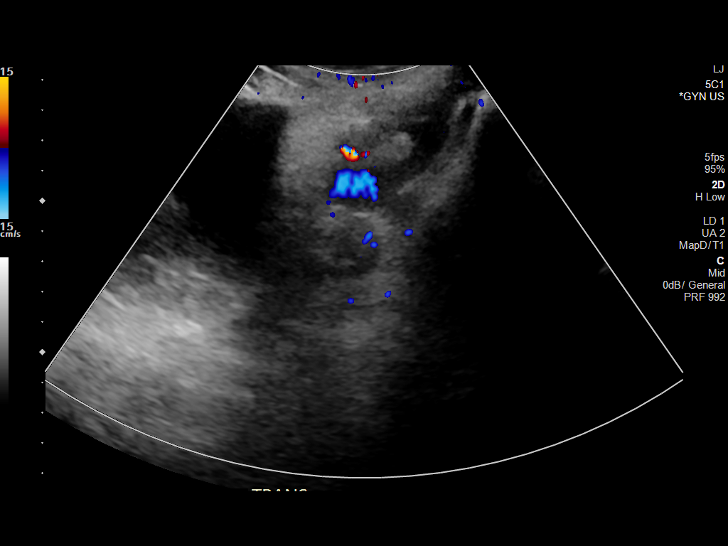
[im 33/88]
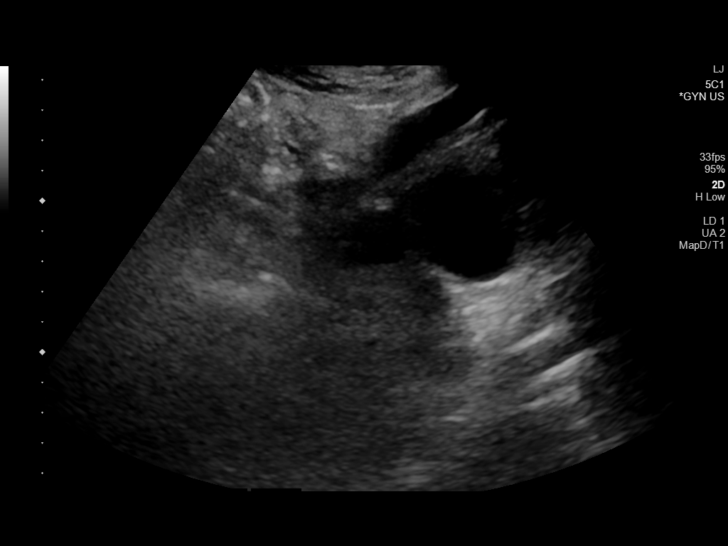
[im 40/88]
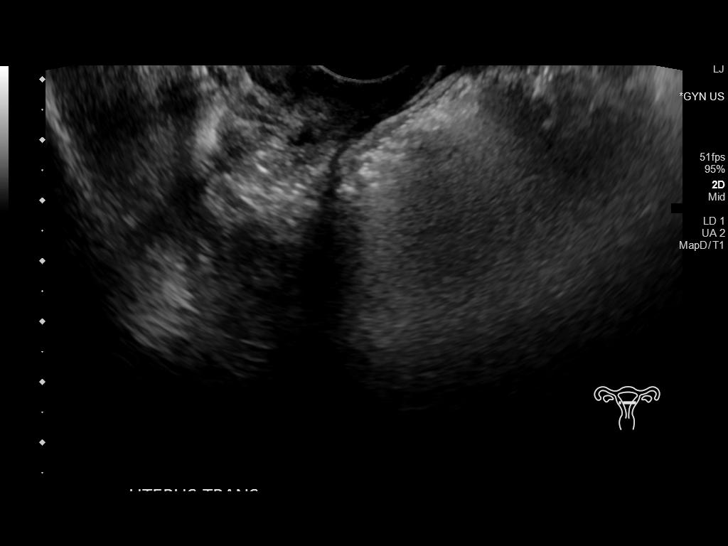
[im 48/88]
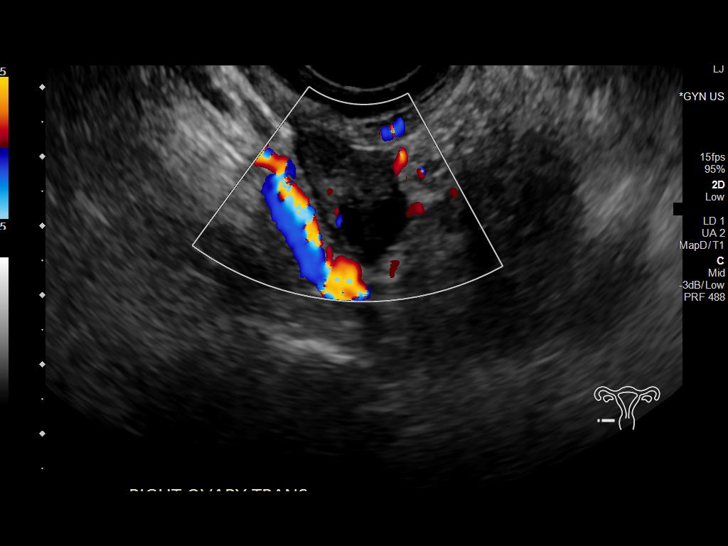
[im 55/88]
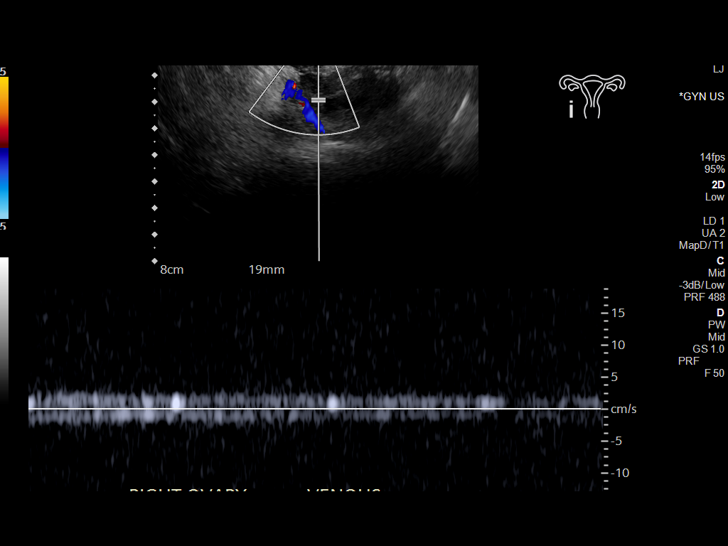
[im 59/88]
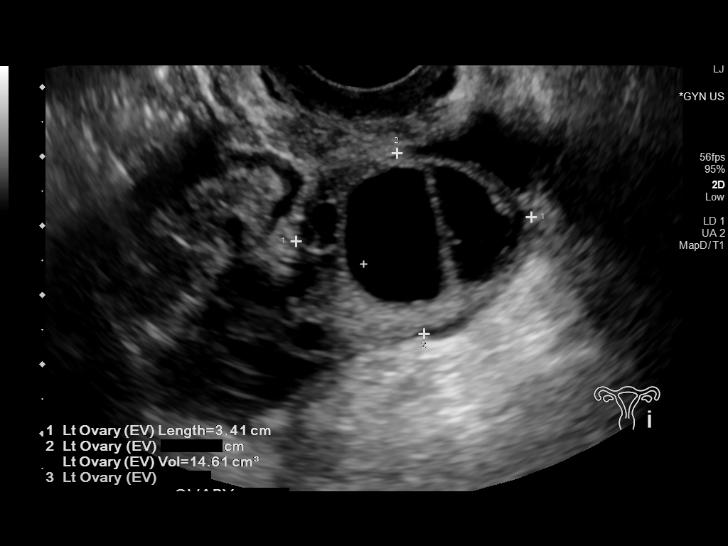
[im 66/88]
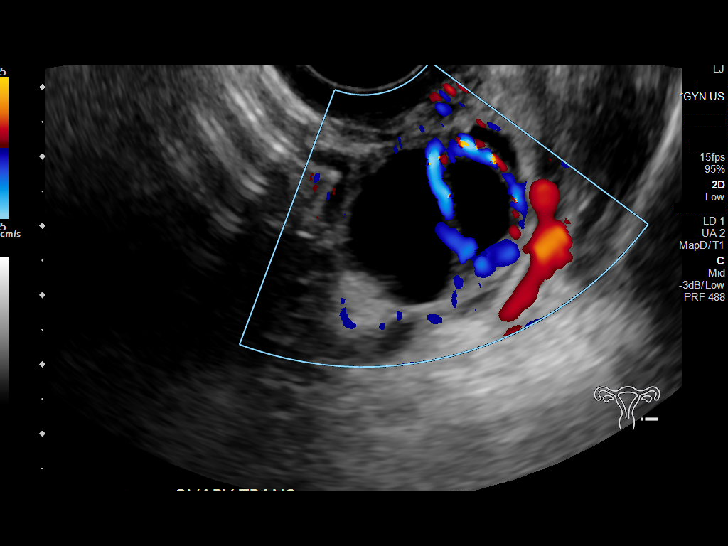
[im 73/88]
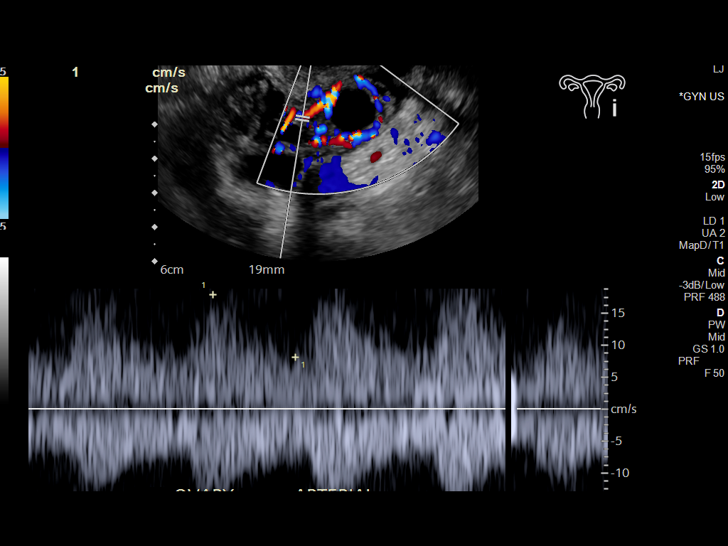
[im 80/88]
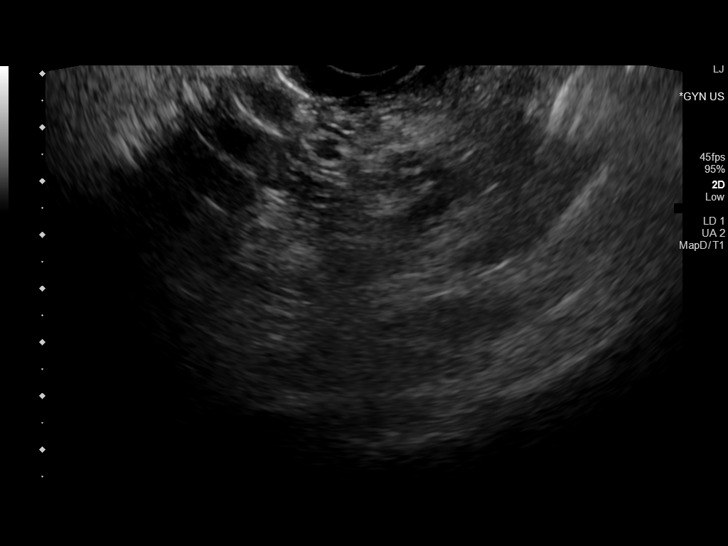
[im 88/88]
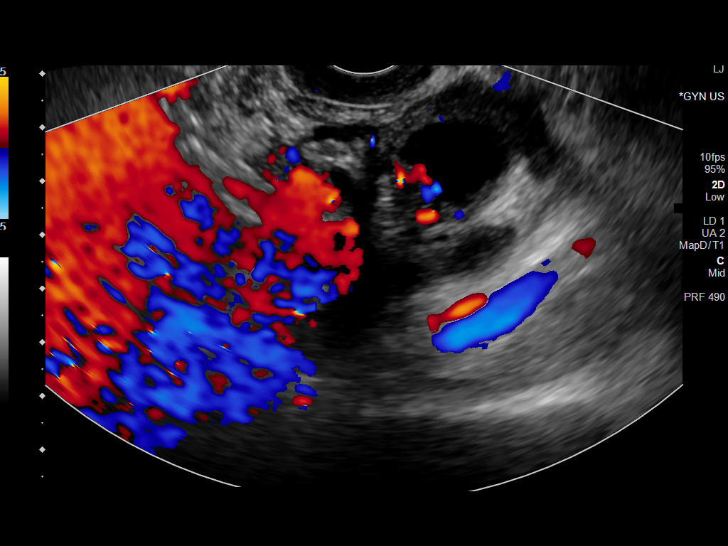

[14 of 25 positions shown; findings below may reference images not displayed]

FINDINGS: Right ovary: 2.6 x 1.4 x 1.5 cm, volume 2.7 mL. Normal appearance.
No adnexal masses.

Left ovary: 3.4 x 2.6 x 3.1 cm, volume 14.6 mL. Septated cyst
measuring 2.6 x 2.1 x 2.4 cm. Ovary otherwise unremarkable. No
adnexal masses.

Pulsed Doppler evaluation of both ovaries demonstrates normal
low-resistance arterial and venous waveforms.
IMPRESSION: 1. No evidence of ovarian torsion. Normal arterial and venous blood
flow to both ovaries.
2. Septated cyst the left ovary, 2.6 cm. This is almost certainly
benign/physiologic with no follow-up recommended.

## 2023-01-31 ENCOUNTER — Ambulatory Visit: Payer: 59 | Admitting: Podiatry

## 2023-01-31 ENCOUNTER — Ambulatory Visit (INDEPENDENT_AMBULATORY_CARE_PROVIDER_SITE_OTHER): Payer: 59

## 2023-01-31 ENCOUNTER — Encounter: Payer: Self-pay | Admitting: Podiatry

## 2023-01-31 DIAGNOSIS — M722 Plantar fascial fibromatosis: Secondary | ICD-10-CM | POA: Diagnosis not present

## 2023-01-31 DIAGNOSIS — M7750 Other enthesopathy of unspecified foot: Secondary | ICD-10-CM

## 2023-01-31 DIAGNOSIS — M7751 Other enthesopathy of right foot: Secondary | ICD-10-CM

## 2023-01-31 MED ORDER — MELOXICAM 15 MG PO TABS: 15.0000 mg | ORAL_TABLET | Freq: Every day | ORAL | 3 refills | Status: DC

## 2023-01-31 NOTE — Patient Instructions (Addendum)

## 2023-01-31 NOTE — Progress Notes (Signed)
  Subjective:  Patient ID: Sarah Greene, female    DOB: March 13, 1986,  MRN: [id number]  Chief Complaint  Patient presents with   Foot Pain    NP  right foot pain    37 y.o. female presents with the above complaint. History confirmed with patient.  It has been going on for some time, she works on her feet as a Engineer, manufacturing, is in hard floors and wears crocs at work  Objective:  Physical Exam: warm, good capillary refill, no trophic changes or ulcerative lesions, normal DP and PT pulses, and normal sensory exam. Left Foot: normal exam, no swelling, tenderness, instability; ligaments intact, full range of motion of all ankle/foot joints Right Foot: point tenderness over the heel pad  No images are attached to the encounter.  Radiographs: Multiple views x-ray of the right foot: no fracture, dislocation, swelling or degenerative changes noted Assessment:   1. Plantar fasciitis, right      Plan:  Patient was evaluated and treated and all questions answered.  Discussed the etiology and treatment options for plantar fasciitis including stretching, formal physical therapy, supportive shoegears such as a running shoe or sneaker, pre fabricated orthoses, injection therapy, and oral medications. We also discussed the role of surgical treatment of this for patients who do not improve after exhausting non-surgical treatment options.   -XR reviewed with patient -Educated patient on stretching and icing of the affected limb -Injection delivered to the plantar fascia of the right foot. -Rx for meloxicam. Educated on use, risks and benefits of the medication  After sterile prep with povidone-iodine solution and alcohol, the right heel was injected with 0.5cc 2% xylocaine plain, 0.5cc 0.5% marcaine plain, 4m triamcinolone acetonide, and 245mdexamethasone was injected along the medial plantar fascia at the insertion on the plantar calcaneus. The patient tolerated the procedure well without  complication.  Return in about 1 month (around 03/01/2023) for recheck plantar fasciitis.

## 2023-02-12 ENCOUNTER — Encounter: Payer: Self-pay | Admitting: Family Medicine

## 2023-02-12 ENCOUNTER — Ambulatory Visit: Payer: 59 | Admitting: Family Medicine

## 2023-02-12 VITALS — BP 116/74 | HR 75 | Temp 97.9°F | Ht 68.5 in | Wt 144.0 lb

## 2023-02-12 DIAGNOSIS — F411 Generalized anxiety disorder: Secondary | ICD-10-CM | POA: Diagnosis not present

## 2023-02-12 DIAGNOSIS — F321 Major depressive disorder, single episode, moderate: Secondary | ICD-10-CM

## 2023-02-12 DIAGNOSIS — R0789 Other chest pain: Secondary | ICD-10-CM

## 2023-02-12 DIAGNOSIS — F17200 Nicotine dependence, unspecified, uncomplicated: Secondary | ICD-10-CM | POA: Diagnosis not present

## 2023-02-12 DIAGNOSIS — Z720 Tobacco use: Secondary | ICD-10-CM | POA: Insufficient documentation

## 2023-02-12 MED ORDER — BUPROPION HCL ER (XL) 150 MG PO TB24: 150.0000 mg | ORAL_TABLET | Freq: Every day | ORAL | 0 refills | Status: DC

## 2023-02-12 NOTE — Assessment & Plan Note (Signed)
While chest pain is a concern, recent workups suggest non-cardiac etiology. However, will continue to monitor symptoms.   Plan: Educate patient on red flags for chest pain that would warrant immediate medical attention such as severe pain or shortness of breath. Re-evaluate in follow-up visit or sooner if new symptoms develop. Encourage stress reduction techniques as chest pain may be anxiety-related.

## 2023-02-12 NOTE — Assessment & Plan Note (Signed)
The patient has a long history of smoking and has made previous attempts to quit, with a strong desire to stop smoking now. Plan:  Start buPROPion (WELLBUTRIN XL) 150 MG 24hr tablet, which may aid in reducing cravings and facilitating smoking cessation. Refer the patient to a Smoking Cessation Program for additional support. Discuss the use of the 1-800-QUIT-NOW line as a resource.

## 2023-02-12 NOTE — Assessment & Plan Note (Signed)
The patient presents with symptoms suggestive of moderate depression, including lack of pleasure in activities, social withdrawal, recent bereavement, and significant life events contributing to mood changes.   Differential diagnosis: Major depressive disorder Adjustment disorder with depressed and anxious mood  Grief reaction   Plan: Prescribe buPROPion (WELLBUTRIN XL) 150 MG 24hr tablet for potential dual benefit in managing depression and assisting with smoking cessation. Refer to [facility] for counseling to address mood symptoms and provide supportive therapy. Encourage the patient to engage in self-care activities and suggest follow-up in one month to evaluate the response to medication and discuss the effectiveness of counseling.

## 2023-02-12 NOTE — Assessment & Plan Note (Signed)
Patient reports symptoms of anxiety including nervousness around people, contrasting with her previous social behavior.   Plan: Include anxiety management as part of the Wellbutrin treatment plan given its indication for such. Behavioral Health referral to address anxiety symptoms, with a focus on identifying stressors and developing coping strategies. Review and modify plan based on response to therapy and counseling in follow-up visits.

## 2023-02-12 NOTE — Patient Instructions (Addendum)
Call [facility] is available 24/7 toll-free at 1-800-QUIT-NOW 865-416-2891). Interpretation services available for many languages. Spanish: 1-800-Dejelo-Ya (1-8607350238) TTYWW:7622179 Or check online for more information:  [email or web address]  Please go to the emergency department, if you develop severe chest pain shortness of breath

## 2023-02-12 NOTE — Progress Notes (Signed)
Assessment/Plan:   Problem List Items Addressed This Visit       Other   Atypical chest pain - Primary    While chest pain is a concern, recent workups suggest non-cardiac etiology. However, will continue to monitor symptoms.   Plan: Educate patient on red flags for chest pain that would warrant immediate medical attention such as severe pain or shortness of breath. Re-evaluate in follow-up visit or sooner if new symptoms develop. Encourage stress reduction techniques as chest pain may be anxiety-related.      Depression, major, single episode, moderate ([place])     The patient presents with symptoms suggestive of moderate depression, including lack of pleasure in activities, social withdrawal, recent bereavement, and significant life events contributing to mood changes.   Differential diagnosis: Major depressive disorder Adjustment disorder with depressed and anxious mood  Grief reaction   Plan: Prescribe buPROPion (WELLBUTRIN XL) 150 MG 24hr tablet for potential dual benefit in managing depression and assisting with smoking cessation. Refer to [facility] for counseling to address mood symptoms and provide supportive therapy. Encourage the patient to engage in self-care activities and suggest follow-up in one month to evaluate the response to medication and discuss the effectiveness of counseling.      Relevant Medications   buPROPion (WELLBUTRIN XL) 150 MG 24 hr tablet   Other Relevant Orders   Ambulatory referral to [facility]   GAD (generalized anxiety disorder)    Patient reports symptoms of anxiety including nervousness around people, contrasting with her previous social behavior.   Plan: Include anxiety management as part of the Wellbutrin treatment plan given its indication for such. Behavioral Health referral to address anxiety symptoms, with a focus on identifying stressors and developing coping strategies. Review and modify plan based on response to  therapy and counseling in follow-up visits.      Relevant Medications   buPROPion (WELLBUTRIN XL) 150 MG 24 hr tablet   Other Relevant Orders   Ambulatory referral to [facility]   Smoking    The patient has a long history of smoking and has made previous attempts to quit, with a strong desire to stop smoking now. Plan:  Start buPROPion (WELLBUTRIN XL) 150 MG 24hr tablet, which may aid in reducing cravings and facilitating smoking cessation. Refer the patient to a Smoking Cessation Program for additional support. Discuss the use of the 1-800-QUIT-NOW line as a resource.      Relevant Medications   buPROPion (WELLBUTRIN XL) 150 MG 24 hr tablet   Other Relevant Orders   Ambulatory referral to Smoking Cessation Program   Patient vascular department if develop severe chest pain shortness of breath   There are no discontinued medications.    Subjective:  HPI: Encounter date: 02/12/2023  Sarah Greene is a 37 y.o. female who has Atypical chest pain; Depression, major, single episode, moderate ([place]); GAD (generalized anxiety disorder); and Smoking on their problem list..   She  has a past medical history of Anxiety (12/22/2017), Arthritis, Chronic cervical pain, Chronic lower back pain, Depression (02/03/2022), Family history of adverse reaction to anesthesia, Fibroids (05/19/2018), Migraines, Pneumonia (2005), PONV (postoperative nausea and vomiting), and Sleep apnea (2019)..   She presents with chief complaint of Establish Care (Chest x months ) .   CHIEF COMPLAINT: Patient presents to establish care and reports concerns regarding chest pain, difficulty in stopping smoking, and experiencing symptoms of depression and anxiety.  HISTORY OF PRESENT ILLNESS:  Chest Pain. The patient has a history of chest  pain workup done last August which was predominantly negative for cardiac etiology.  She reports that these have improved.  Currently, there are no reports of shortness of  breath or exertional discomfort which might suggest an immediate cardiovascular etiology.  Depression. The patient reports going through depression, with recent life events including bereavement and relationship issues impacting her mood and daily function. She expresses a decrease in interest and pleasure in activities, social withdrawal, and nervousness around people.  Smoking. The patient expresses a strong desire to stop smoking cigarettes and has attempted various methods including nicotine patches and gum, with limited success. She reports that her insurance requires a primary care provider's signature to access smoking cessation medications.  ROS:  Cardiology: Chest pain reported in the past, but recent presentations have been unrelated to heart issues per previous workup. Psychiatric: Patient reports symptoms indicative of depression and anxiety. Respiratory: No acute respiratory complaints at this time. General: No notable weight changes, fever, or fatigue reported.  Past Surgical History:  Procedure Laterality Date   ABDOMINAL HYSTERECTOMY     HYSTERECTOMY ABDOMINAL WITH SALPINGECTOMY Bilateral 08/12/2018   Procedure: HYSTERECTOMY ABDOMINAL WITH SALPINGECTOMY;  Surgeon: Emily Filbert, MD;  Location: [facility];  Service: Gynecology;  Laterality: Bilateral;   WISDOM TOOTH EXTRACTION      Outpatient Medications Prior to Visit  Medication Sig Dispense Refill   fluticasone (FLONASE) 50 MCG/ACT nasal spray Place 2 sprays into both nostrils daily. 16 g 6   meloxicam (MOBIC) 15 MG tablet Take 1 tablet (15 mg total) by mouth daily. 30 tablet 3   Multiple Vitamins-Minerals (MULTIVITAMIN GUMMIES ADULT) CHEW Chew 2 tablets by mouth daily.     albuterol (VENTOLIN HFA) 108 (90 Base) MCG/ACT inhaler Inhale 1-2 puffs into the lungs every 6 (six) hours as needed for wheezing or shortness of breath. 8 g 0   cetirizine (ZYRTEC) 10 MG tablet Take 1 tablet (10 mg total) by mouth daily. (Patient not  taking: Reported on 02/12/2023) 30 tablet 11   cyclobenzaprine (FLEXERIL) 5 MG tablet Take 1 tablet (5 mg total) by mouth 3 (three) times daily as needed. (Patient not taking: Reported on 02/12/2023) 15 tablet 0   gabapentin (NEURONTIN) 300 MG capsule  (Patient not taking: Reported on 02/12/2023)     tiZANidine (ZANAFLEX) 4 MG tablet  (Patient not taking: Reported on 02/12/2023)     No facility-administered medications prior to visit.    Family History  Problem Relation Age of Onset   Hypertension Mother    Arthritis Mother    ADD / ADHD Mother    Hypertension Other    Diabetes Other    Asthma Brother    Alcohol abuse Maternal Uncle    Depression Maternal Aunt    Drug abuse Maternal Aunt    Drug abuse Maternal Uncle    Intellectual disability Maternal Aunt     Social History   Socioeconomic History   Marital status: Significant Other    Spouse name: Not on file   Number of children: Not on file   Years of education: Not on file   Highest education level: Not on file  Occupational History   Not on file  Tobacco Use   Smoking status: Every Day    Packs/day: 0.50    Years: 15.00    Total pack years: 7.50    Types: Cigarettes    Start date: 2002    Passive exposure: Never   Smokeless tobacco: Never  Vaping Use   Vaping Use:  Never used  Substance and Sexual Activity   Alcohol use: Yes    Alcohol/week: 17.0 standard drinks of alcohol    Types: 5 Glasses of wine, 12 Cans of beer per week   Drug use: No   Sexual activity: Yes    Birth control/protection: Surgical  Other Topics Concern   Not on file  Social History Narrative   Not on file   Social Determinants of Health   Financial Resource Strain: Not on file  Food Insecurity: Not on file  Transportation Needs: Not on file  Physical Activity: Not on file  Stress: Not on file  Social Connections: Not on file  Intimate Partner Violence: Not on file                                                                                                  Objective:  Physical Exam: BP 116/74 (BP Location: Left Arm, Patient Position: Sitting, Cuff Size: Large)   Pulse 75   Temp 97.9 F (36.6 C) (Temporal)   Ht 5' 8.5" (1.74 m)   Wt 144 lb (65.3 kg)   LMP 08/06/2018   SpO2 98%   BMI 21.58 kg/m    Physical Exam Constitutional:      General: She is not in acute distress.    Appearance: Normal appearance. She is not ill-appearing or toxic-appearing.  HENT:     Head: Normocephalic and atraumatic.     Nose: Nose normal. No congestion.  Eyes:     General: No scleral icterus.    Extraocular Movements: Extraocular movements intact.  Cardiovascular:     Rate and Rhythm: Normal rate and regular rhythm.     Pulses: Normal pulses.     Heart sounds: Normal heart sounds.  Pulmonary:     Effort: Pulmonary effort is normal. No respiratory distress.     Breath sounds: Normal breath sounds.  Abdominal:     General: Abdomen is flat. Bowel sounds are normal.     Palpations: Abdomen is soft.  Musculoskeletal:        General: Normal range of motion.  Lymphadenopathy:     Cervical: No cervical adenopathy.  Skin:    General: Skin is warm and dry.     Findings: No rash.  Neurological:     General: No focal deficit present.     Mental Status: She is alert and oriented to person, place, and time. Mental status is at baseline.  Psychiatric:        Mood and Affect: Mood normal.        Behavior: Behavior normal.        Thought Content: Thought content normal.        Judgment: Judgment normal.     From 07/2022 LABS: Previous lab work reviewed, including CBC with mildly low platelets at 147 and negative troponin, normal fibrinogen, unremarkable BMP.  EKG with normal sinus rhythm with no ST variation IMAGING: Previous chest x-ray was unremarkable.      Alesia Banda, MD, MS

## 2023-03-07 ENCOUNTER — Ambulatory Visit: Payer: 59 | Admitting: Podiatry

## 2023-03-07 DIAGNOSIS — M722 Plantar fascial fibromatosis: Secondary | ICD-10-CM | POA: Diagnosis not present

## 2023-03-11 ENCOUNTER — Other Ambulatory Visit: Payer: Self-pay | Admitting: Family Medicine

## 2023-03-11 DIAGNOSIS — F17200 Nicotine dependence, unspecified, uncomplicated: Secondary | ICD-10-CM

## 2023-03-11 DIAGNOSIS — F411 Generalized anxiety disorder: Secondary | ICD-10-CM

## 2023-03-11 DIAGNOSIS — F321 Major depressive disorder, single episode, moderate: Secondary | ICD-10-CM

## 2023-03-11 NOTE — Telephone Encounter (Signed)
Left patient a detailed voice message to return call to office to schedule f/u visit with Dr. Janee Morn

## 2023-03-11 NOTE — Progress Notes (Signed)
  Subjective:  Patient ID: Sarah Greene, female    DOB: 1986-06-23,  MRN: [id number]  Chief Complaint  Patient presents with   Plantar Fasciitis    recheck plantar fasciitis 4 week follow up    37 y.o. female presents with the above complaint. History confirmed with patient.  She is doing much better not having any pain now  Objective:  Physical Exam: warm, good capillary refill, no trophic changes or ulcerative lesions, normal DP and PT pulses, and normal sensory exam. Left Foot: normal exam, no swelling, tenderness, instability; ligaments intact, full range of motion of all ankle/foot joints Right Foot:  No pain to palpation  No images are attached to the encounter.  Radiographs: Multiple views x-ray of the right foot: no fracture, dislocation, swelling or degenerative changes noted Assessment:   1. Plantar fasciitis, right      Plan:  Patient was evaluated and treated and all questions answered.  Doing much better we discussed the possibility of recurrence.  Resume stretching exercises as needed if this returns.  Return to see me.  If it worsens  No follow-ups on file.

## 2023-03-19 ENCOUNTER — Other Ambulatory Visit: Payer: Self-pay | Admitting: Family Medicine

## 2023-03-19 DIAGNOSIS — F321 Major depressive disorder, single episode, moderate: Secondary | ICD-10-CM

## 2023-03-19 DIAGNOSIS — F411 Generalized anxiety disorder: Secondary | ICD-10-CM

## 2023-03-19 DIAGNOSIS — F17200 Nicotine dependence, unspecified, uncomplicated: Secondary | ICD-10-CM

## 2023-03-19 NOTE — Telephone Encounter (Signed)
[facility] faxed over a rx refill request for Buproprion.  Left patient a detailed voice message to return call to office to schedule a follow up visit for smoking, anxiety, depression and to discuss refills.

## 2023-04-13 ENCOUNTER — Telehealth: Payer: 59 | Admitting: Family Medicine

## 2023-04-13 DIAGNOSIS — B3731 Acute candidiasis of vulva and vagina: Secondary | ICD-10-CM | POA: Diagnosis not present

## 2023-04-13 DIAGNOSIS — N3000 Acute cystitis without hematuria: Secondary | ICD-10-CM

## 2023-04-13 MED ORDER — SULFAMETHOXAZOLE-TRIMETHOPRIM 800-160 MG PO TABS: 1.0000 | ORAL_TABLET | Freq: Two times a day (BID) | ORAL | 0 refills | Status: AC

## 2023-04-13 MED ORDER — FLUCONAZOLE 150 MG PO TABS: 150.0000 mg | ORAL_TABLET | Freq: Once | ORAL | 0 refills | Status: AC

## 2023-04-13 NOTE — Patient Instructions (Signed)
Vaginal Yeast Infection, Adult  Vaginal yeast infection is a condition that causes vaginal discharge as well as soreness, swelling, and redness (inflammation) of the vagina. This is a common condition. Some women get this infection frequently. What are the causes? This condition is caused by a change in the normal balance of the yeast (Candida) and normal bacteria that live in the vagina. This change causes an overgrowth of yeast, which causes the inflammation. What increases the risk? The condition is more likely to develop in women who: Take antibiotic medicines. Have diabetes. Take birth control pills. Are pregnant. Douche often. Have a weak body defense system (immune system). Have been taking steroid medicines for a long time. Frequently wear tight clothing. What are the signs or symptoms? Symptoms of this condition include: White, thick, creamy vaginal discharge. Swelling, itching, redness, and irritation of the vagina. The lips of the vagina (labia) may be affected as well. Pain or a burning feeling while urinating. Pain during sex. How is this diagnosed? This condition is diagnosed based on: Your medical history. A physical exam. A pelvic exam. Your health care provider will examine a sample of your vaginal discharge under a microscope. Your health care provider may send this sample for testing to confirm the diagnosis. How is this treated? This condition is treated with medicine. Medicines may be over-the-counter or prescription. You may be told to use one or more of the following: Medicine that is taken by mouth (orally). Medicine that is applied as a cream (topically). Medicine that is inserted directly into the vagina (suppository). Follow these instructions at home: Take or apply over-the-counter and prescription medicines only as told by your health care provider. Do not use tampons until your health care provider approves. Do not have sex until your infection has  cleared. Sex can prolong or worsen your symptoms of infection. Ask your health care provider when it is safe to resume sexual activity. Keep all follow-up visits. This is important. How is this prevented?  Do not wear tight clothes, such as pantyhose or tight pants. Wear breathable cotton underwear. Do not use douches, perfumed soap, creams, or powders. Wipe from front to back after using the toilet. If you have diabetes, keep your blood sugar levels under control. Ask your health care provider for other ways to prevent yeast infections. Contact a health care provider if: You have a fever. Your symptoms go away and then return. Your symptoms do not get better with treatment. Your symptoms get worse. You have new symptoms. You develop blisters in or around your vagina. You have blood coming from your vagina and it is not your menstrual period. You develop pain in your abdomen. Summary Vaginal yeast infection is a condition that causes discharge as well as soreness, swelling, and redness (inflammation) of the vagina. This condition is treated with medicine. Medicines may be over-the-counter or prescription. Take or apply over-the-counter and prescription medicines only as told by your health care provider. Do not douche. Resume sexual activity or use of tampons as instructed by your health care provider. Contact a health care provider if your symptoms do not get better with treatment or your symptoms go away and then return. This information is not intended to replace advice given to you by your health care provider. Make sure you discuss any questions you have with your health care provider. Document Revised: 02/06/2021 Document Reviewed: 02/06/2021 Elsevier Patient Education  2023 [employer] Urinary Tract Infection, Adult  A urinary tract infection (UTI) is an   infection of any part of the urinary tract. The urinary tract includes the kidneys, ureters, bladder, and urethra. These  organs make, store, and get rid of urine in the body. An upper UTI affects the ureters and kidneys. A lower UTI affects the bladder and urethra. What are the causes? Most urinary tract infections are caused by bacteria in your genital area around your urethra, where urine leaves your body. These bacteria grow and cause inflammation of your urinary tract. What increases the risk? You are more likely to develop this condition if: You have a urinary catheter that stays in place. You are not able to control when you urinate or have a bowel movement (incontinence). You are female and you: Use a spermicide or diaphragm for birth control. Have low estrogen levels. Are pregnant. You have certain genes that increase your risk. You are sexually active. You take antibiotic medicines. You have a condition that causes your flow of urine to slow down, such as: An enlarged prostate, if you are female. Blockage in your urethra. A kidney stone. A nerve condition that affects your bladder control (neurogenic bladder). Not getting enough to drink, or not urinating often. You have certain medical conditions, such as: Diabetes. A weak disease-fighting system (immunesystem). Sickle cell disease. Gout. Spinal cord injury. What are the signs or symptoms? Symptoms of this condition include: Needing to urinate right away (urgency). Frequent urination. This may include small amounts of urine each time you urinate. Pain or burning with urination. Blood in the urine. Urine that smells bad or unusual. Trouble urinating. Cloudy urine. Vaginal discharge, if you are female. Pain in the abdomen or the lower back. You may also have: Vomiting or a decreased appetite. Confusion. Irritability or tiredness. A fever or chills. Diarrhea. The first symptom in older adults may be confusion. In some cases, they may not have any symptoms until the infection has worsened. How is this diagnosed? This condition is  diagnosed based on your medical history and a physical exam. You may also have other tests, including: Urine tests. Blood tests. Tests for STIs (sexually transmitted infections). If you have had more than one UTI, a cystoscopy or imaging studies may be done to determine the cause of the infections. How is this treated? Treatment for this condition includes: Antibiotic medicine. Over-the-counter medicines to treat discomfort. Drinking enough water to stay hydrated. If you have frequent infections or have other conditions such as a kidney stone, you may need to see a health care provider who specializes in the urinary tract (urologist). In rare cases, urinary tract infections can cause sepsis. Sepsis is a life-threatening condition that occurs when the body responds to an infection. Sepsis is treated in the hospital with IV antibiotics, fluids, and other medicines. Follow these instructions at home:  Medicines Take over-the-counter and prescription medicines only as told by your health care provider. If you were prescribed an antibiotic medicine, take it as told by your health care provider. Do not stop using the antibiotic even if you start to feel better. General instructions Make sure you: Empty your bladder often and completely. Do not hold urine for long periods of time. Empty your bladder after sex. Wipe from front to back after urinating or having a bowel movement if you are female. Use each tissue only one time when you wipe. Drink enough fluid to keep your urine pale yellow. Keep all follow-up visits. This is important. Contact a health care provider if: Your symptoms do not get better after 1-2   days. Your symptoms go away and then return. Get help right away if: You have severe pain in your back or your lower abdomen. You have a fever or chills. You have nausea or vomiting. Summary A urinary tract infection (UTI) is an infection of any part of the urinary tract, which includes  the kidneys, ureters, bladder, and urethra. Most urinary tract infections are caused by bacteria in your genital area. Treatment for this condition often includes antibiotic medicines. If you were prescribed an antibiotic medicine, take it as told by your health care provider. Do not stop using the antibiotic even if you start to feel better. Keep all follow-up visits. This is important. This information is not intended to replace advice given to you by your health care provider. Make sure you discuss any questions you have with your health care provider. Document Revised: 07/01/2020 Document Reviewed: 07/01/2020 Elsevier Patient Education  2023 [employer]  

## 2023-04-13 NOTE — Progress Notes (Signed)
Virtual Visit Consent   Sarah Greene, you are scheduled for a virtual visit with a [facility] provider today. Just as with appointments in the office, your consent must be obtained to participate. Your consent will be active for this visit and any virtual visit you may have with one of our providers in the next 365 days. If you have a MyChart account, a copy of this consent can be sent to you electronically.  As this is a virtual visit, video technology does not allow for your provider to perform a traditional examination. This may limit your provider's ability to fully assess your condition. If your provider identifies any concerns that need to be evaluated in person or the need to arrange testing (such as labs, EKG, etc.), we will make arrangements to do so. Although advances in technology are sophisticated, we cannot ensure that it will always work on either your end or our end. If the connection with a video visit is poor, the visit may have to be switched to a telephone visit. With either a video or telephone visit, we are not always able to ensure that we have a secure connection.  By engaging in this virtual visit, you consent to the provision of healthcare and authorize for your insurance to be billed (if applicable) for the services provided during this visit. Depending on your insurance coverage, you may receive a charge related to this service.  I need to obtain your verbal consent now. Are you willing to proceed with your visit today? Sarah Greene has provided verbal consent on 04/13/2023 for a virtual visit (video or telephone). Georgana Curio, FNP  Date: 04/13/2023 11:54 AM  Virtual Visit via Video Note   I, Georgana Curio, connected with  Sarah Greene  ([id number], [birth year]) on 04/13/23 at 12:00 PM EDT by a video-enabled telemedicine application and verified that I am speaking with the correct person using two identifiers.  Location: Patient: Virtual Visit Location Patient:  Home Provider: Virtual Visit Location Provider: Home Office   I discussed the limitations of evaluation and management by telemedicine and the availability of in person appointments. The patient expressed understanding and agreed to proceed.    History of Present Illness: Sarah Greene is a 37 y.o. who identifies as a female who was assigned female at birth, and is being seen today for burning, frequency and lower abd pressure for 1 day. No fever. Also has a little itching. Marland Kitchen  HPI: HPI  Problems:  Patient Active Problem List   Diagnosis Date Noted   Atypical chest pain 02/12/2023   Depression, major, single episode, moderate (HCC) 02/12/2023   GAD (generalized anxiety disorder) 02/12/2023   Smoking 02/12/2023    Allergies: No Known Allergies Medications:  Current Outpatient Medications:    fluconazole (DIFLUCAN) 150 MG tablet, Take 1 tablet (150 mg total) by mouth once for 1 dose., Disp: 1 tablet, Rfl: 0   sulfamethoxazole-trimethoprim (BACTRIM DS) 800-160 MG tablet, Take 1 tablet by mouth 2 (two) times daily for 7 days., Disp: 14 tablet, Rfl: 0   albuterol (VENTOLIN HFA) 108 (90 Base) MCG/ACT inhaler, Inhale 1-2 puffs into the lungs every 6 (six) hours as needed for wheezing or shortness of breath., Disp: 8 g, Rfl: 0   buPROPion (WELLBUTRIN XL) 150 MG 24 hr tablet, TAKE 1 TABLET(150 MG) BY MOUTH DAILY, Disp: 30 tablet, Rfl: 0   cetirizine (ZYRTEC) 10 MG tablet, Take 1 tablet (10 mg total) by mouth daily. (Patient not taking: Reported on 02/12/2023), Disp: 30  tablet, Rfl: 11   cyclobenzaprine (FLEXERIL) 5 MG tablet, Take 1 tablet (5 mg total) by mouth 3 (three) times daily as needed. (Patient not taking: Reported on 02/12/2023), Disp: 15 tablet, Rfl: 0   fluticasone (FLONASE) 50 MCG/ACT nasal spray, Place 2 sprays into both nostrils daily., Disp: 16 g, Rfl: 6   gabapentin (NEURONTIN) 300 MG capsule, , Disp: , Rfl:    meloxicam (MOBIC) 15 MG tablet, Take 1 tablet (15 mg total) by mouth  daily., Disp: 30 tablet, Rfl: 3   Multiple Vitamins-Minerals (MULTIVITAMIN GUMMIES ADULT) CHEW, Chew 2 tablets by mouth daily., Disp: , Rfl:    tiZANidine (ZANAFLEX) 4 MG tablet, , Disp: , Rfl:   Observations/Objective: Patient is well-developed, well-nourished in no acute distress.  Resting comfortably  at home.  Head is normocephalic, atraumatic.  No labored breathing.  Speech is clear and coherent with logical content.  Patient is alert and oriented at baseline.    Assessment and Plan: 1. Acute cystitis without hematuria  2. Candidiasis of vagina  UC if sx persist or worsen.   Follow Up Instructions: I discussed the assessment and treatment plan with the patient. The patient was provided an opportunity to ask questions and all were answered. The patient agreed with the plan and demonstrated an understanding of the instructions.  A copy of instructions were sent to the patient via MyChart unless otherwise noted below.     The patient was advised to call back or seek an in-person evaluation if the symptoms worsen or if the condition fails to improve as anticipated.  Time:  I spent 10 minutes with the patient via telehealth technology discussing the above problems/concerns.    Georgana Curio, FNP

## 2023-04-16 ENCOUNTER — Ambulatory Visit: Payer: 59

## 2023-04-17 ENCOUNTER — Ambulatory Visit
Admission: RE | Admit: 2023-04-17 | Discharge: 2023-04-17 | Disposition: A | Discharge status: Home / Self Care | Payer: 59 | Source: Ambulatory Visit | Attending: Family Medicine | Admitting: Family Medicine

## 2023-04-17 NOTE — ED Triage Notes (Signed)
Pt c/o vaginal itching onset ~ 1 week ago reports online visit was given a 1 dose diflucan but irritation continues.

## 2023-04-21 ENCOUNTER — Ambulatory Visit (HOSPITAL_COMMUNITY)
Admission: RE | Admit: 2023-04-21 | Discharge: 2023-04-21 | Disposition: A | Discharge status: Home / Self Care | Payer: 59 | Source: Ambulatory Visit | Attending: Emergency Medicine | Admitting: Emergency Medicine

## 2023-04-21 ENCOUNTER — Encounter (HOSPITAL_COMMUNITY): Payer: Self-pay

## 2023-04-21 VITALS — BP 113/75 | HR 76 | Temp 98.2°F | Resp 16 | Ht 68.0 in | Wt 136.0 lb

## 2023-04-21 DIAGNOSIS — N760 Acute vaginitis: Secondary | ICD-10-CM | POA: Insufficient documentation

## 2023-04-21 DIAGNOSIS — J302 Other seasonal allergic rhinitis: Secondary | ICD-10-CM | POA: Diagnosis not present

## 2023-04-21 MED ORDER — LIDOCAINE 5 % EX OINT: 1.0000 | TOPICAL_OINTMENT | Freq: Four times a day (QID) | CUTANEOUS | 0 refills | Status: DC | PRN

## 2023-04-21 MED ORDER — CETIRIZINE HCL 10 MG PO TABS: 10.0000 mg | ORAL_TABLET | Freq: Every day | ORAL | 1 refills | Status: DC

## 2023-04-21 MED ORDER — FLUTICASONE PROPIONATE 50 MCG/ACT NA SUSP: 1.0000 | Freq: Every day | NASAL | 2 refills | Status: DC

## 2023-04-21 NOTE — ED Provider Notes (Signed)
[facility]    CSN: [id number] Arrival date & time: 04/21/23  1012    HISTORY   Chief Complaint  Patient presents with   Vaginal Itching    Entered by patient   HPI Sarah Greene is a pleasant, 37 y.o. female who presents to urgent care today. Patient here today with c/o vaginal itching X 2 weeks. She did a virtual consultation and was prescribed a 1 day yeast pill and has had a new sexual partner. She is still having itching. She took the medication on 04/13/2023.  The history is provided by the patient.   Past Medical History:  Diagnosis Date   Anxiety 12/22/2017   Arthritis    In my back   Chronic cervical pain    Chronic lower back pain    Depression 02/03/2022   Family history of adverse reaction to anesthesia    severe nausea and vomiting   Fibroids 05/19/2018   Migraines    Pneumonia 2005   3 episodes   PONV (postoperative nausea and vomiting)    Sleep apnea 2019   Patient Active Problem List   Diagnosis Date Noted   Atypical chest pain 02/12/2023   Depression, major, single episode, moderate (HCC) 02/12/2023   GAD (generalized anxiety disorder) 02/12/2023   Smoking 02/12/2023   Past Surgical History:  Procedure Laterality Date   ABDOMINAL HYSTERECTOMY     HYSTERECTOMY ABDOMINAL WITH SALPINGECTOMY Bilateral 08/12/2018   Procedure: HYSTERECTOMY ABDOMINAL WITH SALPINGECTOMY;  Surgeon: Allie Bossier, MD;  Location: [facility];  Service: Gynecology;  Laterality: Bilateral;   WISDOM TOOTH EXTRACTION     OB History     Gravida  0   Para  0   Term  0   Preterm  0   AB  0   Living  0      SAB  0   IAB  0   Ectopic  0   Multiple  0   Live Births  0          Home Medications    Prior to Admission medications   Medication Sig Start Date End Date Taking? Authorizing Provider  albuterol (VENTOLIN HFA) 108 (90 Base) MCG/ACT inhaler Inhale 1-2 puffs into the lungs every 6 (six) hours as needed for wheezing or shortness of breath.  02/06/20   Wieters, Hallie C, PA-C  buPROPion (WELLBUTRIN XL) 150 MG 24 hr tablet TAKE 1 TABLET(150 MG) BY MOUTH DAILY 03/16/23   Garnette Gunner, MD  cetirizine (ZYRTEC) 10 MG tablet Take 1 tablet (10 mg total) by mouth daily. Patient not taking: Reported on 02/12/2023 02/26/22   Jannifer Rodney A, FNP  cyclobenzaprine (FLEXERIL) 5 MG tablet Take 1 tablet (5 mg total) by mouth 3 (three) times daily as needed. Patient not taking: Reported on 02/12/2023 07/05/22   Menshew, Charlesetta Ivory, PA-C  fluticasone (FLONASE) 50 MCG/ACT nasal spray Place 2 sprays into both nostrils daily. 02/26/22   Junie Spencer, FNP  gabapentin (NEURONTIN) 300 MG capsule     [provider]  meloxicam (MOBIC) 15 MG tablet Take 1 tablet (15 mg total) by mouth daily. 01/31/23   Edwin Cap, DPM  Multiple Vitamins-Minerals (MULTIVITAMIN GUMMIES ADULT) CHEW Chew 2 tablets by mouth daily.    [provider]  tiZANidine (ZANAFLEX) 4 MG tablet     [provider]    Family History Family History  Problem Relation Age of Onset   Hypertension Mother    Arthritis Mother  ADD / ADHD Mother    Hypertension Other    Diabetes Other    Asthma Brother    Alcohol abuse Maternal Uncle    Depression Maternal Aunt    Drug abuse Maternal Aunt    Drug abuse Maternal Uncle    Intellectual disability Maternal Aunt    Social History Social History   Tobacco Use   Smoking status: Every Day    Packs/day: 0.50    Years: 15.00    Additional pack years: 0.00    Total pack years: 7.50    Types: Cigarettes    Start date: 2002    Passive exposure: Never   Smokeless tobacco: Never  Vaping Use   Vaping Use: Never used  Substance Use Topics   Alcohol use: Yes    Alcohol/week: 17.0 standard drinks of alcohol    Types: 5 Glasses of wine, 12 Cans of beer per week   Drug use: No   Allergies   Patient has no known allergies.  Review of Systems Review of Systems Pertinent findings revealed after  performing a 14 point review of systems has been noted in the history of present illness.  Physical Exam Vital Signs BP 113/75 (BP Location: Right Arm)   Pulse 76   Temp 98.2 F (36.8 C) (Oral)   Resp 16   Ht 5\' 8"  (1.727 m)   Wt 136 lb (61.7 kg)   LMP 08/06/2018   SpO2 99%   BMI 20.68 kg/m   No data found.  Physical Exam Vitals and nursing note reviewed.  Constitutional:      General: She is not in acute distress.    Appearance: Normal appearance. She is not ill-appearing.  HENT:     Head: Normocephalic and atraumatic.     Salivary Glands: Right salivary gland is not diffusely enlarged or tender. Left salivary gland is not diffusely enlarged or tender.     Right Ear: Ear canal and external ear normal. No drainage. A middle ear effusion is present. There is no impacted cerumen. Tympanic membrane is bulging. Tympanic membrane is not injected or erythematous.     Left Ear: Ear canal and external ear normal. No drainage. A middle ear effusion is present. There is no impacted cerumen. Tympanic membrane is bulging. Tympanic membrane is not injected or erythematous.     Ears:     Comments: Bilateral EACs normal, both TMs bulging with clear fluid    Nose: Rhinorrhea present. No nasal deformity, septal deviation, signs of injury, nasal tenderness, mucosal edema or congestion. Rhinorrhea is clear.     Right Nostril: Occlusion present. No foreign body, epistaxis or septal hematoma.     Left Nostril: Occlusion present. No foreign body, epistaxis or septal hematoma.     Right Turbinates: Enlarged, swollen and pale.     Left Turbinates: Enlarged, swollen and pale.     Right Sinus: No maxillary sinus tenderness or frontal sinus tenderness.     Left Sinus: No maxillary sinus tenderness or frontal sinus tenderness.     Mouth/Throat:     Lips: Pink. No lesions.     Mouth: Mucous membranes are moist. No oral lesions.     Pharynx: Oropharynx is clear. Uvula midline. No posterior oropharyngeal  erythema or uvula swelling.     Tonsils: No tonsillar exudate. 0 on the right. 0 on the left.     Comments: Postnasal drip Eyes:     General: Lids are normal.        Right  eye: No discharge.        Left eye: No discharge.     Extraocular Movements: Extraocular movements intact.     Conjunctiva/sclera: Conjunctivae normal.     Right eye: Right conjunctiva is not injected.     Left eye: Left conjunctiva is not injected.  Neck:     Trachea: Trachea and phonation normal.  Cardiovascular:     Rate and Rhythm: Normal rate and regular rhythm.     Pulses: Normal pulses.     Heart sounds: Normal heart sounds. No murmur heard.    No friction rub. No gallop.  Pulmonary:     Effort: Pulmonary effort is normal. No accessory muscle usage, prolonged expiration or respiratory distress.     Breath sounds: Normal breath sounds. No stridor, decreased air movement or transmitted upper airway sounds. No decreased breath sounds, wheezing, rhonchi or rales.  Chest:     Chest wall: No tenderness.  Genitourinary:    Comments: Patient politely declines pelvic exam today, patient provided a vaginal swab for testing. Musculoskeletal:        General: Normal range of motion.     Cervical back: Normal range of motion and neck supple. Normal range of motion.  Lymphadenopathy:     Cervical: No cervical adenopathy.  Skin:    General: Skin is warm and dry.     Findings: No erythema or rash.  Neurological:     General: No focal deficit present.     Mental Status: She is alert and oriented to person, place, and time.  Psychiatric:        Mood and Affect: Mood normal.        Behavior: Behavior normal.     Visual Acuity Right Eye Distance:   Left Eye Distance:   Bilateral Distance:    Right Eye Near:   Left Eye Near:    Bilateral Near:     UC Couse / Diagnostics / Procedures:     Radiology No results found.  Procedures Procedures (including critical care time) EKG  Pending results:  Labs  Reviewed  CERVICOVAGINAL ANCILLARY ONLY    Medications Ordered in UC: Medications - No data to display  UC Diagnoses / Final Clinical Impressions(s)   I have reviewed the triage vital signs and the nursing notes.  Pertinent labs & imaging results that were available during my care of the patient were reviewed by me and considered in my medical decision making (see chart for details).    Final diagnoses:  Vaginitis and vulvovaginitis  Seasonal allergic rhinitis, unspecified trigger  Based on physical exam findings, patient provided with a prescription for cetirizine and fluticasone for treatment of allergy symptoms.  STD screening was performed, patient advised that the results be posted to their MyChart and if any of the results are positive, they will be notified by phone, further treatment will be provided as indicated based on results of STD screening. Patient was advised to abstain from sexual intercourse until that they receive the results of their STD testing.  Patient was also advised to use condoms to protect themselves from STD exposure. Patient was provided with lidocaine ointment for relief of vaginal discomfort while awaiting test results. Return precautions advised.  Drug allergies reviewed, all questions addressed.   Please see discharge instructions below for details of plan of care as provided to patient. ED Prescriptions     Medication Sig Dispense Auth. Provider   cetirizine (ZYRTEC ALLERGY) 10 MG tablet Take 1 tablet (10 mg  total) by mouth at bedtime. 90 tablet Theadora Rama Scales, PA-C   lidocaine (XYLOCAINE) 5 % ointment Apply 1 Application topically 4 (four) times daily as needed. 35.44 g Theadora Rama Scales, PA-C   fluticasone (FLONASE) 50 MCG/ACT nasal spray Place 1 spray into both nostrils daily. Begin by using 2 sprays in each nare daily for 3 to 5 days, then decrease to 1 spray in each nare daily. 15.8 mL Theadora Rama Scales, PA-C      PDMP not  reviewed this encounter.  Disposition Upon Discharge:  Condition: stable for discharge home  Patient presented with concern for an acute illness with associated systemic symptoms and significant discomfort requiring urgent management. In my opinion, this is a condition that a prudent lay person (someone who possesses an average knowledge of health and medicine) may potentially expect to result in complications if not addressed urgently such as respiratory distress, impairment of bodily function or dysfunction of bodily organs.   As such, the patient has been evaluated and assessed, work-up was performed and treatment was provided in alignment with urgent care protocols and evidence based medicine.  Patient/parent/caregiver has been advised that the patient may require follow up for further testing and/or treatment if the symptoms continue in spite of treatment, as clinically indicated and appropriate.  Routine symptom specific, illness specific and/or disease specific instructions were discussed with the patient and/or caregiver at length.  Prevention strategies for avoiding STD exposure were also discussed.  The patient will follow up with their current PCP if and as advised. If the patient does not currently have a PCP we will assist them in obtaining one.   The patient may need specialty follow up if the symptoms continue, in spite of conservative treatment and management, for further workup, evaluation, consultation and treatment as clinically indicated and appropriate.  Patient/parent/caregiver verbalized understanding and agreement of plan as discussed.  All questions were addressed during visit.  Please see discharge instructions below for further details of plan.  Discharge Instructions:   Discharge Instructions      Your symptoms and my physical exam findings are concerning for exacerbation of your underlying allergies.     Please read below to learn more about the medications,  dosages and frequencies that I recommend to help alleviate your symptoms and to get you feeling better soon:   Zyrtec (cetirizine): This is an excellent second-generation antihistamine that helps to reduce respiratory inflammatory response to environmental allergens.  In some patients, this medication can cause daytime sleepiness so I recommend that you take 1 tablet daily at bedtime.     Dymista (fluticasone and azelastine): This is a combination nasal spray that contains both a nasal steroid and nasal antihistamine.  Please use 1 spray in each nare twice daily or every 12 hours.  This medication works best when it is used regularly, not "as needed".  You may find that it takes a few days for this to reach full effectiveness, please be patient with his onboarding process.  The most common side effect of this medication is nosebleeds.  Please discontinue use for 1 week if this occurs, then resume.   The results of your vaginal swab test which screens for BV, yeast, gonorrhea, chlamydia and trichomonas will be made posted to your MyChart account once it is complete.  This typically takes 2 to 4 days.  Please abstain from sexual intercourse of any kind, vaginal, oral or anal, until you have received the results of your STD testing.  If any of your results are abnormal, you will receive a phone call regarding treatment.  Prescriptions, if any are needed, will be provided for you at your pharmacy.  Because you have recently taken 1 dose of Diflucan on Apr 13, 2023 and had incomplete resolution of your symptoms, if you test positive for vaginal yeast today, recommended to receive a prescription for Diflucan 150 mg 1 tablet every 3 days for 2 doses.   For comfort, while you are waiting for the result of your vaginal swab test, I have provided you with an external vaginal cream that contains lidocaine which you can apply 3-4 times daily for relief of vaginal itching and burning.  I recommend that you abstain  from sexual intercourse, tampon use or any other other intravaginal activities while you are waiting on these results and possible treatment.   If you have not had complete resolution of your symptoms after completing any recommended treatment or if your symptoms worsen, please return for repeat evaluation.   Thank you for visiting urgent care today.  I appreciate the opportunity to participate in your care.        This office note has been dictated using Teaching laboratory technician.  Unfortunately, this method of dictation can sometimes lead to typographical or grammatical errors.  I apologize for your inconvenience in advance if this occurs.  Please do not hesitate to reach out to me if clarification is needed.       Theadora Rama Scales, [place] 04/22/23 [phone]

## 2023-04-21 NOTE — Discharge Instructions (Addendum)
Your symptoms and my physical exam findings are concerning for exacerbation of your underlying allergies.     Please read below to learn more about the medications, dosages and frequencies that I recommend to help alleviate your symptoms and to get you feeling better soon:   Zyrtec (cetirizine): This is an excellent second-generation antihistamine that helps to reduce respiratory inflammatory response to environmental allergens.  In some patients, this medication can cause daytime sleepiness so I recommend that you take 1 tablet daily at bedtime.     Dymista (fluticasone and azelastine): This is a combination nasal spray that contains both a nasal steroid and nasal antihistamine.  Please use 1 spray in each nare twice daily or every 12 hours.  This medication works best when it is used regularly, not "as needed".  You may find that it takes a few days for this to reach full effectiveness, please be patient with his onboarding process.  The most common side effect of this medication is nosebleeds.  Please discontinue use for 1 week if this occurs, then resume.   The results of your vaginal swab test which screens for BV, yeast, gonorrhea, chlamydia and trichomonas will be made posted to your MyChart account once it is complete.  This typically takes 2 to 4 days.  Please abstain from sexual intercourse of any kind, vaginal, oral or anal, until you have received the results of your STD testing.     If any of your results are abnormal, you will receive a phone call regarding treatment.  Prescriptions, if any are needed, will be provided for you at your pharmacy.  Because you have recently taken 1 dose of Diflucan on Apr 13, 2023 and had incomplete resolution of your symptoms, if you test positive for vaginal yeast today, recommended to receive a prescription for Diflucan 150 mg 1 tablet every 3 days for 2 doses.   For comfort, while you are waiting for the result of your vaginal swab test, I have provided  you with an external vaginal cream that contains lidocaine which you can apply 3-4 times daily for relief of vaginal itching and burning.  I recommend that you abstain from sexual intercourse, tampon use or any other other intravaginal activities while you are waiting on these results and possible treatment.   If you have not had complete resolution of your symptoms after completing any recommended treatment or if your symptoms worsen, please return for repeat evaluation.   Thank you for visiting urgent care today.  I appreciate the opportunity to participate in your care.

## 2023-04-21 NOTE — ED Triage Notes (Signed)
Patient here today with c/o vaginal itching X 2 weeks. She did a virtual consultation and was prescribed a 1 day yeast pill and has had a new sexual partner. She is still having itching. She took the medication on 04/13/2023.

## 2023-04-23 LAB — CERVICOVAGINAL ANCILLARY ONLY
Bacterial Vaginitis (gardnerella): POSITIVE — AB
Candida Glabrata: NEGATIVE
Candida Vaginitis: NEGATIVE
Chlamydia: NEGATIVE
Comment: NEGATIVE
Comment: NEGATIVE
Comment: NEGATIVE
Comment: NEGATIVE
Comment: NEGATIVE
Comment: NORMAL
Neisseria Gonorrhea: NEGATIVE
Trichomonas: NEGATIVE

## 2023-04-24 ENCOUNTER — Telehealth (HOSPITAL_COMMUNITY): Payer: Self-pay | Admitting: Emergency Medicine

## 2023-04-24 MED ORDER — METRONIDAZOLE 500 MG PO TABS: 500.0000 mg | ORAL_TABLET | Freq: Two times a day (BID) | ORAL | 0 refills | Status: DC

## 2023-05-15 ENCOUNTER — Ambulatory Visit (HOSPITAL_COMMUNITY)
Admission: RE | Admit: 2023-05-15 | Discharge: 2023-05-15 | Disposition: A | Discharge status: Home / Self Care | Payer: 59 | Source: Ambulatory Visit | Attending: Internal Medicine | Admitting: Internal Medicine

## 2023-05-15 ENCOUNTER — Encounter (HOSPITAL_COMMUNITY): Payer: Self-pay

## 2023-05-15 VITALS — BP 115/80 | HR 89 | Temp 99.0°F | Resp 16

## 2023-05-15 DIAGNOSIS — N760 Acute vaginitis: Secondary | ICD-10-CM | POA: Diagnosis not present

## 2023-05-15 MED ORDER — FLUCONAZOLE 150 MG PO TABS: 150.0000 mg | ORAL_TABLET | ORAL | 0 refills | Status: AC

## 2023-05-15 MED ORDER — METRONIDAZOLE 0.75 % VA GEL: 1.0000 | Freq: Every day | VAGINAL | 0 refills | Status: AC

## 2023-05-15 NOTE — ED Triage Notes (Addendum)
Pt was called in Rx for metronidazole for +BV 04/24/23 - states completed course and had resolution of sxs, but then started with vaginal irritation again 2 days ago. Denies abd pain. Has been using Rx lido cream to help with the irritation.

## 2023-05-15 NOTE — ED Provider Notes (Signed)
[facility]    CSN: [id number] Arrival date & time: 05/15/23  1707      History   Chief Complaint Chief Complaint  Patient presents with   Appt    1730   Vaginal Irritation    HPI Sarah Greene is a 37 y.o. female.   Patient presents to urgent care for evaluation of vaginal itching and discomfort that started 2 days ago. States she was recently sexually active with a new female partner a few weeks ago and experienced vaginal irritation afterwards. She was seen at urgent care and treated for bacterial vaginitis with flagyl BID for 7 days. She took medications as prescribed and states her symptoms improved for 1 week until she received oral intercourse from partner again a few days ago and symptoms returned. She's experiencing vaginal itching and irritation. Denies discharge but reports it's very uncomfortable and with small odor. Denies urinary symptoms, nausea, vomiting, and diarrhea. No abdominal pain. Denies history of immunosuppression. Has not attempted use of any OTC medications to help with symptoms. She states she never experienced vaginitis with previous partner and this all started when she became sexually active with new partner.      Past Medical History:  Diagnosis Date   Anxiety 12/22/2017   Arthritis    In my back   Chronic cervical pain    Chronic lower back pain    Depression 02/03/2022   Family history of adverse reaction to anesthesia    severe nausea and vomiting   Fibroids 05/19/2018   Migraines    Pneumonia 2005   3 episodes   PONV (postoperative nausea and vomiting)    Sleep apnea 2019    Patient Active Problem List   Diagnosis Date Noted   Atypical chest pain 02/12/2023   Depression, major, single episode, moderate (HCC) 02/12/2023   GAD (generalized anxiety disorder) 02/12/2023   Smoking 02/12/2023    Past Surgical History:  Procedure Laterality Date   ABDOMINAL HYSTERECTOMY     HYSTERECTOMY ABDOMINAL WITH SALPINGECTOMY  Bilateral 08/12/2018   Procedure: HYSTERECTOMY ABDOMINAL WITH SALPINGECTOMY;  Surgeon: Allie Bossier, MD;  Location: [facility];  Service: Gynecology;  Laterality: Bilateral;   WISDOM TOOTH EXTRACTION      OB History     Gravida  0   Para  0   Term  0   Preterm  0   AB  0   Living  0      SAB  0   IAB  0   Ectopic  0   Multiple  0   Live Births  0            Home Medications    Prior to Admission medications   Medication Sig Start Date End Date Taking? Authorizing Provider  fluconazole (DIFLUCAN) 150 MG tablet Take 1 tablet (150 mg total) by mouth every 3 (three) days for 2 doses. 05/15/23 05/19/23 Yes Rishi Vicario, Donavan Burnet, FNP  lidocaine (XYLOCAINE) 5 % ointment Apply 1 Application topically 4 (four) times daily as needed. 04/21/23  Yes Theadora Rama Scales, PA-C  metroNIDAZOLE (METROGEL) 0.75 % vaginal gel Place 1 Applicatorful vaginally at bedtime for 5 days. 05/15/23 05/20/23 Yes Carlisle Beers, FNP  buPROPion (WELLBUTRIN XL) 150 MG 24 hr tablet TAKE 1 TABLET(150 MG) BY MOUTH DAILY 03/16/23   Garnette Gunner, MD  cetirizine (ZYRTEC ALLERGY) 10 MG tablet Take 1 tablet (10 mg total) by mouth at bedtime. 04/21/23 10/18/23  Theadora Rama Scales, PA-C  fluticasone (  FLONASE) 50 MCG/ACT nasal spray Place 1 spray into both nostrils daily. Begin by using 2 sprays in each nare daily for 3 to 5 days, then decrease to 1 spray in each nare daily. 04/21/23   Theadora Rama Scales, PA-C  meloxicam (MOBIC) 15 MG tablet Take 1 tablet (15 mg total) by mouth daily. 01/31/23   McDonald, Rachelle Hora, DPM  metroNIDAZOLE (FLAGYL) 500 MG tablet Take 1 tablet (500 mg total) by mouth 2 (two) times daily. 04/24/23   LampteyBritta Mccreedy, MD  Multiple Vitamins-Minerals (MULTIVITAMIN GUMMIES ADULT) CHEW Chew 2 tablets by mouth daily.    [provider]    Family History Family History  Problem Relation Age of Onset   Hypertension Mother    Arthritis Mother    ADD / ADHD Mother     Hypertension Other    Diabetes Other    Asthma Brother    Alcohol abuse Maternal Uncle    Depression Maternal Aunt    Drug abuse Maternal Aunt    Drug abuse Maternal Uncle    Intellectual disability Maternal Aunt     Social History Social History   Tobacco Use   Smoking status: Every Day    Packs/day: 0.50    Years: 15.00    Additional pack years: 0.00    Total pack years: 7.50    Types: Cigarettes    Start date: 2002    Passive exposure: Never   Smokeless tobacco: Never  Vaping Use   Vaping Use: Never used  Substance Use Topics   Alcohol use: Yes    Alcohol/week: 17.0 standard drinks of alcohol    Types: 5 Glasses of wine, 12 Cans of beer per week   Drug use: No     Allergies   Patient has no known allergies.   Review of Systems Review of Systems Per HPI  Physical Exam Triage Vital Signs ED Triage Vitals  Enc Vitals Group     BP 05/15/23 1720 115/80     Pulse Rate 05/15/23 1720 89     Resp 05/15/23 1720 16     Temp 05/15/23 1720 99 F (37.2 C)     Temp Source 05/15/23 1720 Oral     SpO2 05/15/23 1720 98 %     Weight --      Height --      Head Circumference --      Peak Flow --      Pain Score 05/15/23 1721 10     Pain Loc --      Pain Edu? --      Excl. in GC? --    No data found.  Updated Vital Signs BP 115/80   Pulse 89   Temp 99 F (37.2 C) (Oral)   Resp 16   LMP 08/06/2018   SpO2 98%   Visual Acuity Right Eye Distance:   Left Eye Distance:   Bilateral Distance:    Right Eye Near:   Left Eye Near:    Bilateral Near:     Physical Exam Vitals and nursing note reviewed.  Constitutional:      Appearance: She is not ill-appearing or toxic-appearing.  HENT:     Head: Normocephalic and atraumatic.     Right Ear: Hearing and external ear normal.     Left Ear: Hearing and external ear normal.     Nose: Nose normal.     Mouth/Throat:     Lips: Pink.  Eyes:     General: Lids  are normal. Vision grossly intact. Gaze aligned  appropriately.     Extraocular Movements: Extraocular movements intact.     Conjunctiva/sclera: Conjunctivae normal.  Pulmonary:     Effort: Pulmonary effort is normal.  Genitourinary:    Comments: Deferred. Musculoskeletal:     Cervical back: Neck supple.  Skin:    General: Skin is warm and dry.     Capillary Refill: Capillary refill takes less than 2 seconds.     Findings: No rash.  Neurological:     General: No focal deficit present.     Mental Status: She is alert and oriented to person, place, and time. Mental status is at baseline.     Cranial Nerves: No dysarthria or facial asymmetry.  Psychiatric:        Mood and Affect: Mood normal.        Speech: Speech normal.        Behavior: Behavior normal.        Thought Content: Thought content normal.        Judgment: Judgment normal.      UC Treatments / Results  Labs (all labs ordered are listed, but only abnormal results are displayed) Labs Reviewed  CERVICOVAGINAL ANCILLARY ONLY    EKG   Radiology No results found.  Procedures Procedures (including critical care time)  Medications Ordered in UC Medications - No data to display  Initial Impression / Assessment and Plan / UC Course  I have reviewed the triage vital signs and the nursing notes.  Pertinent labs & imaging results that were available during my care of the patient were reviewed by me and considered in my medical decision making (see chart for details).   1. Vaginitis and vulvovaginitis Presentation consistent with likely vaginal yeast infection related to recent antibiotic use. Will go ahead and treat with diflucan once today then again in 3 days for suspected yeast vaginitis. Will treat for recurrent BV symptoms with metrogel once nightly for 5 nights. Advised to avoid standing up after applying medication. Discussed safe sex precautions and use of non-scented soaps/detergents. Will treat for any other positive results based on vaginal swab.    Discussed physical exam and available lab work findings in clinic with patient.  Counseled patient regarding appropriate use of medications and potential side effects for all medications recommended or prescribed today. Discussed red flag signs and symptoms of worsening condition,when to call the PCP office, return to urgent care, and when to seek higher level of care in the emergency department. Patient verbalizes understanding and agreement with plan. All questions answered. Patient discharged in stable condition.    Final Clinical Impressions(s) / UC Diagnoses   Final diagnoses:  Vaginitis and vulvovaginitis     Discharge Instructions      Take diflucan once today, then again in 3 days to treat suspected vaginal yeast infection related to recent antibiotic use.  Use intravaginal flagyl gel as prescribed. After using the gel, do not stand up as this will cause the medicine to come out of the vaginal area due to gravity.   The vaginal swab we collected today will come back in 2-3 days and we will notify you if you need to be treated for any other positive results on the swab.  If you develop any new or worsening symptoms or do not improve in the next 2 to 3 days, please return.  If your symptoms are severe, please go to the emergency room.  Follow-up with your primary care provider for  further evaluation and management of your symptoms as well as ongoing wellness visits.  I hope you feel better!    ED Prescriptions     Medication Sig Dispense Auth. Provider   metroNIDAZOLE (METROGEL) 0.75 % vaginal gel Place 1 Applicatorful vaginally at bedtime for 5 days. 50 g Reita May M, FNP   fluconazole (DIFLUCAN) 150 MG tablet Take 1 tablet (150 mg total) by mouth every 3 (three) days for 2 doses. 2 tablet Carlisle Beers, FNP      PDMP not reviewed this encounter.   Carlisle Beers, [place] 05/15/23 1810

## 2023-05-15 NOTE — Discharge Instructions (Signed)
Take diflucan once today, then again in 3 days to treat suspected vaginal yeast infection related to recent antibiotic use.  Use intravaginal flagyl gel as prescribed. After using the gel, do not stand up as this will cause the medicine to come out of the vaginal area due to gravity.   The vaginal swab we collected today will come back in 2-3 days and we will notify you if you need to be treated for any other positive results on the swab.  If you develop any new or worsening symptoms or do not improve in the next 2 to 3 days, please return.  If your symptoms are severe, please go to the emergency room.  Follow-up with your primary care provider for further evaluation and management of your symptoms as well as ongoing wellness visits.  I hope you feel better!

## 2023-05-16 LAB — CERVICOVAGINAL ANCILLARY ONLY
Bacterial Vaginitis (gardnerella): NEGATIVE
Candida Glabrata: NEGATIVE
Candida Vaginitis: POSITIVE — AB
Chlamydia: NEGATIVE
Comment: NEGATIVE
Comment: NEGATIVE
Comment: NEGATIVE
Comment: NEGATIVE
Comment: NEGATIVE
Comment: NORMAL
Neisseria Gonorrhea: NEGATIVE
Trichomonas: NEGATIVE

## 2023-08-21 ENCOUNTER — Ambulatory Visit: Payer: 59 | Admitting: Family Medicine

## 2023-08-21 ENCOUNTER — Telehealth: Payer: Self-pay | Admitting: Family Medicine

## 2023-08-21 NOTE — Telephone Encounter (Signed)
9.18.24 no show / no show letter sent / no reson for cancellation

## 2023-08-22 ENCOUNTER — Ambulatory Visit: Payer: 59 | Admitting: Family Medicine

## 2023-08-22 NOTE — Telephone Encounter (Signed)
1st missed visit/same day cancel, letter sent via mail

## 2023-09-21 ENCOUNTER — Telehealth: Payer: 59 | Admitting: Physician Assistant

## 2023-09-21 DIAGNOSIS — B9689 Other specified bacterial agents as the cause of diseases classified elsewhere: Secondary | ICD-10-CM | POA: Diagnosis not present

## 2023-09-21 DIAGNOSIS — J208 Acute bronchitis due to other specified organisms: Secondary | ICD-10-CM

## 2023-09-21 MED ORDER — BENZONATATE 100 MG PO CAPS
100.0000 mg | ORAL_CAPSULE | Freq: Three times a day (TID) | ORAL | 0 refills | Status: DC | PRN
Start: 2023-09-21 — End: 2024-02-03

## 2023-09-21 MED ORDER — AZITHROMYCIN 250 MG PO TABS
ORAL_TABLET | ORAL | 0 refills | Status: AC
Start: 2023-09-21 — End: 2023-09-26

## 2023-09-21 MED ORDER — FLUCONAZOLE 150 MG PO TABS: ORAL_TABLET | ORAL | 0 refills | Status: DC

## 2023-09-21 NOTE — Progress Notes (Signed)
Virtual Visit Consent   Sarah Greene, you are scheduled for a virtual visit with a [facility] provider today. Just as with appointments in the office, your consent must be obtained to participate. Your consent will be active for this visit and any virtual visit you may have with one of our providers in the next 365 days. If you have a MyChart account, a copy of this consent can be sent to you electronically.  As this is a virtual visit, video technology does not allow for your provider to perform a traditional examination. This may limit your provider's ability to fully assess your condition. If your provider identifies any concerns that need to be evaluated in person or the need to arrange testing (such as labs, EKG, etc.), we will make arrangements to do so. Although advances in technology are sophisticated, we cannot ensure that it will always work on either your end or our end. If the connection with a video visit is poor, the visit may have to be switched to a telephone visit. With either a video or telephone visit, we are not always able to ensure that we have a secure connection.  By engaging in this virtual visit, you consent to the provision of healthcare and authorize for your insurance to be billed (if applicable) for the services provided during this visit. Depending on your insurance coverage, you may receive a charge related to this service.  I need to obtain your verbal consent now. Are you willing to proceed with your visit today? Sarah Greene has provided verbal consent on 09/21/2023 for a virtual visit (video or telephone). Piedad Climes, [place]  Date: 09/21/2023 2:20 PM  Virtual Visit via Video Note   I, Piedad Climes, connected with  Sarah Greene  ([id number], 1986-11-07) on 09/21/23 at  2:15 PM EDT by a video-enabled telemedicine application and verified that I am speaking with the correct person using two identifiers.  Location: Patient: Virtual Visit Location  Patient: Home Provider: Virtual Visit Location Provider: Home Office   I discussed the limitations of evaluation and management by telemedicine and the availability of in person appointments. The patient expressed understanding and agreed to proceed.    History of Present Illness: Sarah Greene is a 37 y.o. who identifies as a female who was assigned female at birth, and is being seen today for several days of increased chest congestion with cough that is productive of thick green phlegm. This has been associated with some R-sided chest wall tenderness and low-grade fever. Has not tested for COVID. Denies recent travel.  OTC -- Nothing.  HPI: HPI  Problems:  Patient Active Problem List   Diagnosis Date Noted   Atypical chest pain 02/12/2023   Depression, major, single episode, moderate (HCC) 02/12/2023   GAD (generalized anxiety disorder) 02/12/2023   Smoking 02/12/2023    Allergies: No Known Allergies Medications:  Current Outpatient Medications:    azithromycin (ZITHROMAX) 250 MG tablet, Take 2 tablets on day 1, then 1 tablet daily on days 2 through 5, Disp: 6 tablet, Rfl: 0   benzonatate (TESSALON) 100 MG capsule, Take 1 capsule (100 mg total) by mouth 3 (three) times daily as needed for cough., Disp: 30 capsule, Rfl: 0   buPROPion (WELLBUTRIN XL) 150 MG 24 hr tablet, TAKE 1 TABLET(150 MG) BY MOUTH DAILY, Disp: 30 tablet, Rfl: 0   cetirizine (ZYRTEC ALLERGY) 10 MG tablet, Take 1 tablet (10 mg total) by mouth at bedtime., Disp: 90 tablet, Rfl: 1  fluticasone (FLONASE) 50 MCG/ACT nasal spray, Place 1 spray into both nostrils daily. Begin by using 2 sprays in each nare daily for 3 to 5 days, then decrease to 1 spray in each nare daily., Disp: 15.8 mL, Rfl: 2   lidocaine (XYLOCAINE) 5 % ointment, Apply 1 Application topically 4 (four) times daily as needed., Disp: 35.44 g, Rfl: 0   Multiple Vitamins-Minerals (MULTIVITAMIN GUMMIES ADULT) CHEW, Chew 2 tablets by mouth daily., Disp: , Rfl:    Observations/Objective: Patient is well-developed, well-nourished in no acute distress.  Resting comfortably at home.  Head is normocephalic, atraumatic.  No labored breathing. Speech is clear and coherent with logical content.  Patient is alert and oriented at baseline.   Assessment and Plan: 1. Acute bacterial bronchitis - azithromycin (ZITHROMAX) 250 MG tablet; Take 2 tablets on day 1, then 1 tablet daily on days 2 through 5  Dispense: 6 tablet; Refill: 0 - benzonatate (TESSALON) 100 MG capsule; Take 1 capsule (100 mg total) by mouth 3 (three) times daily as needed for cough.  Dispense: 30 capsule; Refill: 0  Will have her COVID test as a precaution. She is to message [place] back directly with results. Mostly concerns for bacterial bronchitis giving change in some ongoing congestion/cough. Rx Azithromycin.  Increase fluids.  Rest.  Saline nasal spray.  Probiotic.  Mucinex as directed.  Humidifier in bedroom. Tessalon per orders.  Call or return to clinic if symptoms are not improving.   Follow Up Instructions: I discussed the assessment and treatment plan with the patient. The patient was provided an opportunity to ask questions and all were answered. The patient agreed with the plan and demonstrated an understanding of the instructions.  A copy of instructions were sent to the patient via MyChart unless otherwise noted below.   The patient was advised to call back or seek an in-person evaluation if the symptoms worsen or if the condition fails to improve as anticipated.    Piedad Climes, PA-C

## 2023-09-21 NOTE — Patient Instructions (Signed)
Sarah Greene, thank you for joining Piedad Climes, PA-C for today's virtual visit.  While this provider is not your primary care provider (PCP), if your PCP is located in our provider database this encounter information will be shared with them immediately following your visit.   A [facility] MyChart account gives you access to today's visit and all your visits, tests, and labs performed at [facility] " click here if you don't have a [facility] MyChart account or go to [email or web address]  Consent: (Patient) Sarah Greene provided verbal consent for this virtual visit at the beginning of the encounter.  Current Medications:  Current Outpatient Medications:    buPROPion (WELLBUTRIN XL) 150 MG 24 hr tablet, TAKE 1 TABLET(150 MG) BY MOUTH DAILY, Disp: 30 tablet, Rfl: 0   cetirizine (ZYRTEC ALLERGY) 10 MG tablet, Take 1 tablet (10 mg total) by mouth at bedtime., Disp: 90 tablet, Rfl: 1   fluticasone (FLONASE) 50 MCG/ACT nasal spray, Place 1 spray into both nostrils daily. Begin by using 2 sprays in each nare daily for 3 to 5 days, then decrease to 1 spray in each nare daily., Disp: 15.8 mL, Rfl: 2   lidocaine (XYLOCAINE) 5 % ointment, Apply 1 Application topically 4 (four) times daily as needed., Disp: 35.44 g, Rfl: 0   Multiple Vitamins-Minerals (MULTIVITAMIN GUMMIES ADULT) CHEW, Chew 2 tablets by mouth daily., Disp: , Rfl:    Medications ordered in this encounter:  No orders of the defined types were placed in this encounter.    *If you need refills on other medications prior to your next appointment, please contact your pharmacy*  Follow-Up: Call back or seek an in-person evaluation if the symptoms worsen or if the condition fails to improve as anticipated.  [facility] [phone]  Other Instructions Take antibiotic (Azithromycin) as directed.  Increase fluids.  Get plenty of rest. Use Mucinex for congestion. Tessalon as directed for cough.  Take a daily probiotic (I recommend Align or Culturelle, but even Activia Yogurt may be beneficial).  A humidifier placed in the bedroom may offer some relief for a dry, scratchy throat of nasal irritation.  Read information below on acute bronchitis. Please call or return to clinic if symptoms are not improving.  Acute Bronchitis Bronchitis is when the airways that extend from the windpipe into the lungs get red, puffy, and painful (inflamed). Bronchitis often causes thick spit (mucus) to develop. This leads to a cough. A cough is the most common symptom of bronchitis. In acute bronchitis, the condition usually begins suddenly and goes away over time (usually in 2 weeks). Smoking, allergies, and asthma can make bronchitis worse. Repeated episodes of bronchitis may cause more lung problems.  HOME CARE Rest. Drink enough fluids to keep your pee (urine) clear or pale yellow (unless you need to limit fluids as told by your doctor). Only take over-the-counter or prescription medicines as told by your doctor. Avoid smoking and secondhand smoke. These can make bronchitis worse. If you are a smoker, think about using nicotine gum or skin patches. Quitting smoking will help your lungs heal faster. Reduce the chance of getting bronchitis again by: Washing your hands often. Avoiding people with cold symptoms. Trying not to touch your hands to your mouth, nose, or eyes. Follow up with your doctor as told.  GET HELP IF: Your symptoms do not improve after 1 week of treatment. Symptoms include: Cough. Fever. Coughing up thick spit. Body aches. Chest congestion. Chills. Shortness of breath. Sore  throat.  GET HELP RIGHT AWAY IF:  You have an increased fever. You have chills. You have severe shortness of breath. You have bloody thick spit (sputum). You throw up (vomit) often. You lose too much body fluid (dehydration). You have a severe headache. You faint.  MAKE SURE YOU:  Understand these  instructions. Will watch your condition. Will get help right away if you are not doing well or get worse. Document Released: 05/07/2008 Document Revised: 07/22/2013 Document Reviewed: 05/12/2013 [facility] Patient Information 2015 [place], [place]. This information is not intended to replace advice given to you by your health care provider. Make sure you discuss any questions you have with your health care provider.    If you have been instructed to have an in-person evaluation today at a local Urgent Care facility, please use the link below. It will take you to a list of all of our available [facility], including address, phone number and hours of operation. Please do not delay care.  [facility]  If you or a family member do not have a primary care provider, use the link below to schedule a visit and establish care. When you choose a [facility] primary care physician or advanced practice provider, you gain a long-term partner in health. Find a Primary Care Provider  Learn more about [facility]'s in-office and virtual care options: [facility] - Get Care Now

## 2023-09-24 ENCOUNTER — Other Ambulatory Visit: Payer: Self-pay | Admitting: Family Medicine

## 2023-09-24 DIAGNOSIS — Z1231 Encounter for screening mammogram for malignant neoplasm of breast: Secondary | ICD-10-CM

## 2023-09-30 ENCOUNTER — Ambulatory Visit: Payer: 59 | Admitting: Family Medicine

## 2023-10-03 ENCOUNTER — Encounter (HOSPITAL_COMMUNITY): Payer: Self-pay | Admitting: Emergency Medicine

## 2023-10-03 ENCOUNTER — Ambulatory Visit (HOSPITAL_COMMUNITY)
Admission: EM | Admit: 2023-10-03 | Discharge: 2023-10-03 | Disposition: A | Discharge status: Home / Self Care | Payer: 59 | Attending: Internal Medicine | Admitting: Internal Medicine

## 2023-10-03 ENCOUNTER — Ambulatory Visit (HOSPITAL_COMMUNITY): Payer: 59

## 2023-10-03 DIAGNOSIS — J209 Acute bronchitis, unspecified: Secondary | ICD-10-CM | POA: Diagnosis not present

## 2023-10-03 MED ORDER — PROMETHAZINE-DM 6.25-15 MG/5ML PO SYRP: 5.0000 mL | ORAL_SOLUTION | Freq: Every evening | ORAL | 0 refills | Status: DC | PRN

## 2023-10-03 MED ORDER — PREDNISONE 20 MG PO TABS: 40.0000 mg | ORAL_TABLET | Freq: Every day | ORAL | 0 refills | Status: AC

## 2023-10-03 NOTE — ED Triage Notes (Signed)
Pt had cough that productive for over 2 weeks. Had virtual visit last week who prescribed antibiotics. Reports completed but still having cough and right sided chest pains under right breast.

## 2023-10-03 NOTE — Discharge Instructions (Signed)
You have bronchitis which is inflammation of the upper airways in your lungs due to a virus. The following medicines will help with your symptoms.   - Take steroid pills sent to pharmacy as directed. Do not take any other NSAID containing medications such as ibuprofen or naproxen/Aleve while taking prednisone. - Take cough medicines as prescribed.  - Continue using over the counter medicines as needed as directed. Plain mucinex (guaifenesin) over the counter may further help breakup mucus and help with symptoms.   If you develop any new or worsening symptoms or do not improve in the next 2 to 3 days, please return.  If your symptoms are severe, please go to the emergency room. Follow-up with PCP as needed.

## 2023-10-03 NOTE — ED Provider Notes (Signed)
[facility]    CSN: [id number] Arrival date & time: 10/03/23  1451      History   Chief Complaint Chief Complaint  Patient presents with   Cough   Chest Pain    HPI Sarah Greene is a 37 y.o. female.   Patient presents to urgent care for evaluation of intermittently productive cough that started approximately 2 weeks ago.  She is experiencing right sided rib cage/chest discomfort associated with cough.  Denies shortness of breath, left-sided chest pain, nausea, vomiting, diarrhea, abdominal pain, fevers, chills, and bodyaches.  Current everyday cigarette smoker, denies other drug use.  Denies history of chronic respiratory problems.  She was seen at virtual urgent care last week where she was diagnosed with acute bacterial bronchitis and placed on azithromycin antibiotic.  States the antibiotic helped her feel just a little bit better but not much.  Taking Tessalon Perles without relief.   Cough Associated symptoms: chest pain   Chest Pain Associated symptoms: cough     Past Medical History:  Diagnosis Date   Anxiety 12/22/2017   Arthritis    In my back   Chronic cervical pain    Chronic lower back pain    Depression 02/03/2022   Family history of adverse reaction to anesthesia    severe nausea and vomiting   Fibroids 05/19/2018   Migraines    Pneumonia 2005   3 episodes   PONV (postoperative nausea and vomiting)    Sleep apnea 2019    Patient Active Problem List   Diagnosis Date Noted   Atypical chest pain 02/12/2023   Depression, major, single episode, moderate (HCC) 02/12/2023   GAD (generalized anxiety disorder) 02/12/2023   Smoking 02/12/2023    Past Surgical History:  Procedure Laterality Date   ABDOMINAL HYSTERECTOMY     HYSTERECTOMY ABDOMINAL WITH SALPINGECTOMY Bilateral 08/12/2018   Procedure: HYSTERECTOMY ABDOMINAL WITH SALPINGECTOMY;  Surgeon: Allie Bossier, MD;  Location: [facility];  Service: Gynecology;  Laterality: Bilateral;   WISDOM  TOOTH EXTRACTION      OB History     Gravida  0   Para  0   Term  0   Preterm  0   AB  0   Living  0      SAB  0   IAB  0   Ectopic  0   Multiple  0   Live Births  0            Home Medications    Prior to Admission medications   Medication Sig Start Date End Date Taking? Authorizing Provider  predniSONE (DELTASONE) 20 MG tablet Take 2 tablets (40 mg total) by mouth daily for 5 days. 10/03/23 10/08/23 Yes Carlisle Beers, FNP  promethazine-dextromethorphan (PROMETHAZINE-DM) 6.25-15 MG/5ML syrup Take 5 mLs by mouth at bedtime as needed. 10/03/23  Yes Carlisle Beers, FNP  benzonatate (TESSALON) 100 MG capsule Take 1 capsule (100 mg total) by mouth 3 (three) times daily as needed for cough. 09/21/23   Waldon Merl, PA-C  buPROPion (WELLBUTRIN XL) 150 MG 24 hr tablet TAKE 1 TABLET(150 MG) BY MOUTH DAILY 03/16/23   Garnette Gunner, MD  cetirizine (ZYRTEC ALLERGY) 10 MG tablet Take 1 tablet (10 mg total) by mouth at bedtime. 04/21/23 10/18/23  Theadora Rama Scales, PA-C  fluconazole (DIFLUCAN) 150 MG tablet Take 1 tablet PO once. Repeat in 3 days if needed. 09/21/23   Waldon Merl, PA-C  fluticasone (FLONASE) 50 MCG/ACT nasal spray  Place 1 spray into both nostrils daily. Begin by using 2 sprays in each nare daily for 3 to 5 days, then decrease to 1 spray in each nare daily. 04/21/23   Theadora Rama Scales, PA-C  lidocaine (XYLOCAINE) 5 % ointment Apply 1 Application topically 4 (four) times daily as needed. 04/21/23   Theadora Rama Scales, PA-C  Multiple Vitamins-Minerals (MULTIVITAMIN GUMMIES ADULT) CHEW Chew 2 tablets by mouth daily.    [provider]    Family History Family History  Problem Relation Age of Onset   Hypertension Mother    Arthritis Mother    ADD / ADHD Mother    Hypertension Other    Diabetes Other    Asthma Brother    Alcohol abuse Maternal Uncle    Depression Maternal Aunt    Drug abuse Maternal Aunt     Drug abuse Maternal Uncle    Intellectual disability Maternal Aunt     Social History Social History   Tobacco Use   Smoking status: Every Day    Current packs/day: 0.50    Average packs/day: 0.5 packs/day for 22.8 years (11.4 ttl pk-yrs)    Types: Cigarettes    Start date: 2002    Passive exposure: Never   Smokeless tobacco: Never  Vaping Use   Vaping status: Never Used  Substance Use Topics   Alcohol use: Yes    Alcohol/week: 17.0 standard drinks of alcohol    Types: 5 Glasses of wine, 12 Cans of beer per week   Drug use: No     Allergies   Patient has no known allergies.   Review of Systems Review of Systems  Respiratory:  Positive for cough.   Cardiovascular:  Positive for chest pain.  Per HPI   Physical Exam Triage Vital Signs ED Triage Vitals  Encounter Vitals Group     BP 10/03/23 1547 109/75     Systolic BP Percentile --      Diastolic BP Percentile --      Pulse Rate 10/03/23 1547 90     Resp 10/03/23 1547 15     Temp 10/03/23 1547 98.2 F (36.8 C)     Temp src --      SpO2 10/03/23 1547 97 %     Weight --      Height --      Head Circumference --      Peak Flow --      Pain Score 10/03/23 1546 10     Pain Loc --      Pain Education --      Exclude from Growth Chart --    No data found.  Updated Vital Signs BP 109/75 (BP Location: Left Arm)   Pulse 90   Temp 98.2 F (36.8 C)   Resp 15   LMP 08/06/2018   SpO2 97%   Visual Acuity Right Eye Distance:   Left Eye Distance:   Bilateral Distance:    Right Eye Near:   Left Eye Near:    Bilateral Near:     Physical Exam Vitals and nursing note reviewed.  Constitutional:      Appearance: She is not ill-appearing or toxic-appearing.  HENT:     Head: Normocephalic and atraumatic.     Right Ear: Hearing, tympanic membrane, ear canal and external ear normal.     Left Ear: Hearing, tympanic membrane, ear canal and external ear normal.     Nose: Nose normal.     Mouth/Throat:  Lips:  Pink.     Mouth: Mucous membranes are moist. No injury.     Tongue: No lesions. Tongue does not deviate from midline.     Palate: No mass and lesions.     Pharynx: Oropharynx is clear. Uvula midline. No pharyngeal swelling, oropharyngeal exudate, posterior oropharyngeal erythema or uvula swelling.     Tonsils: No tonsillar exudate or tonsillar abscesses.  Eyes:     General: Lids are normal. Vision grossly intact. Gaze aligned appropriately.     Extraocular Movements: Extraocular movements intact.     Conjunctiva/sclera: Conjunctivae normal.  Cardiovascular:     Rate and Rhythm: Normal rate and regular rhythm.     Heart sounds: Normal heart sounds, S1 normal and S2 normal.  Pulmonary:     Effort: Pulmonary effort is normal. No respiratory distress.     Breath sounds: Normal air entry. Wheezing (Faint expiratory wheeze heard to the right lower lung field, otherwise normal breath sounds) present. No decreased breath sounds, rhonchi or rales.  Abdominal:     General: Bowel sounds are normal.     Palpations: Abdomen is soft.     Tenderness: There is no abdominal tenderness. There is no right CVA tenderness, left CVA tenderness or guarding.  Musculoskeletal:     Cervical back: Neck supple.  Skin:    General: Skin is warm and dry.     Capillary Refill: Capillary refill takes less than 2 seconds.     Findings: No rash.  Neurological:     General: No focal deficit present.     Mental Status: She is alert and oriented to person, place, and time. Mental status is at baseline.     Cranial Nerves: No dysarthria or facial asymmetry.  Psychiatric:        Mood and Affect: Mood normal.        Speech: Speech normal.        Behavior: Behavior normal.        Thought Content: Thought content normal.        Judgment: Judgment normal.      UC Treatments / Results  Labs (all labs ordered are listed, but only abnormal results are displayed) Labs Reviewed - No data to  display  EKG   Radiology No results found.  Procedures Procedures (including critical care time)  Medications Ordered in UC Medications - No data to display  Initial Impression / Assessment and Plan / UC Course  I have reviewed the triage vital signs and the nursing notes.  Pertinent labs & imaging results that were available during my care of the patient were reviewed by me and considered in my medical decision making (see chart for details).   1.  Acute bronchitis Presentation suspicious for acute bronchitis.  Chest x-ray is unremarkable for focal consolidation/pneumonia.  Will call patient if radiology reread shows different finding indicating need for change in treatment plan. Will treat with steroid burst, Mucinex, and Promethazine DM as needed for cough. May follow-up with PCP as needed.  Counseled patient on potential for adverse effects with medications prescribed/recommended today, strict ER and return-to-clinic precautions discussed, patient verbalized understanding.    Final Clinical Impressions(s) / UC Diagnoses   Final diagnoses:  Acute bronchitis, unspecified organism     Discharge Instructions      You have bronchitis which is inflammation of the upper airways in your lungs due to a virus. The following medicines will help with your symptoms.   - Take steroid pills sent to  pharmacy as directed. Do not take any other NSAID containing medications such as ibuprofen or naproxen/Aleve while taking prednisone. - Take cough medicines as prescribed.  - Continue using over the counter medicines as needed as directed. Plain mucinex (guaifenesin) over the counter may further help breakup mucus and help with symptoms.   If you develop any new or worsening symptoms or do not improve in the next 2 to 3 days, please return.  If your symptoms are severe, please go to the emergency room. Follow-up with PCP as needed.    ED Prescriptions     Medication Sig Dispense Auth.  Provider   predniSONE (DELTASONE) 20 MG tablet Take 2 tablets (40 mg total) by mouth daily for 5 days. 10 tablet Carlisle Beers, FNP   promethazine-dextromethorphan (PROMETHAZINE-DM) 6.25-15 MG/5ML syrup Take 5 mLs by mouth at bedtime as needed. 118 mL Carlisle Beers, FNP      PDMP not reviewed this encounter.   Carlisle Beers, [place] 10/03/23 [phone]

## 2023-10-04 ENCOUNTER — Ambulatory Visit (HOSPITAL_COMMUNITY): Payer: 59

## 2023-10-15 DIAGNOSIS — Z1231 Encounter for screening mammogram for malignant neoplasm of breast: Secondary | ICD-10-CM

## 2023-12-05 ENCOUNTER — Ambulatory Visit
Admission: RE | Admit: 2023-12-05 | Discharge: 2023-12-05 | Disposition: A | Discharge status: Home / Self Care | Payer: 59 | Source: Ambulatory Visit | Attending: Family Medicine | Admitting: Family Medicine

## 2023-12-05 VITALS — BP 117/79 | HR 78 | Temp 98.7°F | Resp 17

## 2023-12-05 DIAGNOSIS — M5412 Radiculopathy, cervical region: Secondary | ICD-10-CM

## 2023-12-05 MED ORDER — PREDNISONE 10 MG (21) PO TBPK
ORAL_TABLET | Freq: Every day | ORAL | 0 refills | Status: DC
Start: 2023-12-05 — End: 2024-01-15

## 2023-12-05 MED ORDER — KETOROLAC TROMETHAMINE 30 MG/ML IJ SOLN
30.0000 mg | Freq: Once | INTRAMUSCULAR | Status: AC
  Administered 2023-12-05: 30 mg via INTRAMUSCULAR

## 2023-12-05 MED ORDER — CYCLOBENZAPRINE HCL 10 MG PO TABS
10.0000 mg | ORAL_TABLET | Freq: Two times a day (BID) | ORAL | 0 refills | Status: DC | PRN
Start: 2023-12-05 — End: 2024-02-03

## 2023-12-05 NOTE — Discharge Instructions (Signed)
 You were given a Toradol  injection in clinic today. Do not take any over the counter NSAID's such as Advil , ibuprofen , Aleve , or naproxen  for 24 hours. You may take tylenol  if needed.  Start prednisone  as prescribed.  You may take Flexeril  as needed.  Please of this medication can make you drowsy.  Do not drink alcohol or drive while on this medication.  Please follow-up with [facility] or your PCP in 2 to 3 days for recheck.  Please go to the ER for any worsening symptoms.  I hope you feel better soon!

## 2023-12-05 NOTE — ED Provider Notes (Signed)
 [facility]    CSN: [id number] Arrival date & time: 12/05/23  1253      History   Chief Complaint Chief Complaint  Patient presents with   Hand Problem    Right hand and arm pain can barely grip or move - Entered by patient   Appointment    HPI Sarah Greene is a 38 y.o. female presents for neck pain.  Patient has a history of cervical radiculopathy and has followed with [facility] for this.  She was recommended PT but for unclear reasons was not able to complete.  She states over the past 4 days she has had right sided neck pain with numbness and tingling that goes down her arm to her hand.  States she feels like her hand is slightly weaker than her left hand.  No injury or known inciting event.  Numbness is tingling is positional.  She has been taking ibuprofen  OTC for symptoms, last dose was greater than 24 hours ago.  She does have a PCP but does has not follow-up with them.  No other concerns at this time.  HPI  Past Medical History:  Diagnosis Date   Anxiety 12/22/2017   Arthritis    In my back   Chronic cervical pain    Chronic lower back pain    Depression 02/03/2022   Family history of adverse reaction to anesthesia    severe nausea and vomiting   Fibroids 05/19/2018   Migraines    Pneumonia 2005   3 episodes   PONV (postoperative nausea and vomiting)    Sleep apnea 2019    Patient Active Problem List   Diagnosis Date Noted   Atypical chest pain 02/12/2023   Depression, major, single episode, moderate (HCC) 02/12/2023   GAD (generalized anxiety disorder) 02/12/2023   Smoking 02/12/2023    Past Surgical History:  Procedure Laterality Date   ABDOMINAL HYSTERECTOMY     HYSTERECTOMY ABDOMINAL WITH SALPINGECTOMY Bilateral 08/12/2018   Procedure: HYSTERECTOMY ABDOMINAL WITH SALPINGECTOMY;  Surgeon: Starla Harland BROCKS, MD;  Location: [facility];  Service: Gynecology;  Laterality: Bilateral;   WISDOM TOOTH EXTRACTION      OB History     Gravida  0   Para   0   Term  0   Preterm  0   AB  0   Living  0      SAB  0   IAB  0   Ectopic  0   Multiple  0   Live Births  0            Home Medications    Prior to Admission medications   Medication Sig Start Date End Date Taking? Authorizing Provider  cyclobenzaprine  (FLEXERIL ) 10 MG tablet Take 1 tablet (10 mg total) by mouth 2 (two) times daily as needed for muscle spasms. 12/05/23  Yes Alisabeth Selkirk, Jodi R, NP  predniSONE  (STERAPRED UNI-PAK 21 TAB) 10 MG (21) TBPK tablet Take by mouth daily. Take 6 tabs by mouth daily  for 1 day, then 5 tabs for 1 day, then 4 tabs for 1 day, then 3 tabs for 1 day, 2 tabs for 1 day, then 1 tab by mouth daily for 1 days 12/05/23  Yes Haydin Calandra, Jodi R, NP  benzonatate  (TESSALON ) 100 MG capsule Take 1 capsule (100 mg total) by mouth 3 (three) times daily as needed for cough. 09/21/23   Gladis Elsie BROCKS, PA-C  buPROPion  (WELLBUTRIN  XL) 150 MG 24 hr tablet TAKE 1 TABLET(150 MG)  BY MOUTH DAILY 03/16/23   Sebastian Beverley NOVAK, MD  cetirizine  (ZYRTEC  ALLERGY) 10 MG tablet Take 1 tablet (10 mg total) by mouth at bedtime. 04/21/23 10/18/23  Joesph Shaver Scales, PA-C  fluconazole  (DIFLUCAN ) 150 MG tablet Take 1 tablet PO once. Repeat in 3 days if needed. 09/21/23   Gladis Elsie BROCKS, PA-C  fluticasone  (FLONASE ) 50 MCG/ACT nasal spray Place 1 spray into both nostrils daily. Begin by using 2 sprays in each nare daily for 3 to 5 days, then decrease to 1 spray in each nare daily. 04/21/23   Joesph Shaver Scales, PA-C  lidocaine  (XYLOCAINE ) 5 % ointment Apply 1 Application topically 4 (four) times daily as needed. 04/21/23   Joesph Shaver Scales, PA-C  Multiple Vitamins-Minerals (MULTIVITAMIN GUMMIES ADULT) CHEW Chew 2 tablets by mouth daily.    [provider]  promethazine -dextromethorphan (PROMETHAZINE -DM) 6.25-15 MG/5ML syrup Take 5 mLs by mouth at bedtime as needed. 10/03/23   Enedelia Dorna HERO, FNP    Family History Family History  Problem Relation Age of  Onset   Hypertension Mother    Arthritis Mother    ADD / ADHD Mother    Hypertension Other    Diabetes Other    Asthma Brother    Alcohol abuse Maternal Uncle    Depression Maternal Aunt    Drug abuse Maternal Aunt    Drug abuse Maternal Uncle    Intellectual disability Maternal Aunt     Social History Social History   Tobacco Use   Smoking status: Every Day    Current packs/day: 0.50    Average packs/day: 0.5 packs/day for 23.0 years (11.5 ttl pk-yrs)    Types: Cigarettes    Start date: 2002    Passive exposure: Never   Smokeless tobacco: Never  Vaping Use   Vaping status: Never Used  Substance Use Topics   Alcohol use: Yes    Alcohol/week: 17.0 standard drinks of alcohol    Types: 5 Glasses of wine, 12 Cans of beer per week   Drug use: No     Allergies   Patient has no known allergies.   Review of Systems Review of Systems  Musculoskeletal:        Right neck pain/arm pain     Physical Exam Triage Vital Signs ED Triage Vitals [12/05/23 1340]  Encounter Vitals Group     BP 117/79     Systolic BP Percentile      Diastolic BP Percentile      Pulse Rate 78     Resp 17     Temp 98.7 F (37.1 C)     Temp Source Oral     SpO2 98 %     Weight      Height      Head Circumference      Peak Flow      Pain Score 7     Pain Loc      Pain Education      Exclude from Growth Chart    No data found.  Updated Vital Signs BP 117/79 (BP Location: Right Arm)   Pulse 78   Temp 98.7 F (37.1 C) (Oral)   Resp 17   LMP 08/06/2018   SpO2 98%   Visual Acuity Right Eye Distance:   Left Eye Distance:   Bilateral Distance:    Right Eye Near:   Left Eye Near:    Bilateral Near:     Physical Exam Vitals and nursing note reviewed.  Constitutional:  General: She is not in acute distress.    Appearance: Normal appearance. She is not ill-appearing.  HENT:     Head: Normocephalic and atraumatic.  Eyes:     Pupils: Pupils are equal, round, and reactive  to light.  Neck:      Comments: No swelling or ecchymosis of the neck.  There is tenderness palpation to the right paracervical muscles that extends slightly to right trapezius.  Upper extremity strength is 4.5 out of 5 on right and 5 out of 5 on left.  Full range of motion of neck with pain on rotation to right. Cardiovascular:     Rate and Rhythm: Normal rate.  Pulmonary:     Effort: Pulmonary effort is normal.  Musculoskeletal:     Cervical back: Normal range of motion and neck supple. No edema, erythema, signs of trauma, rigidity, torticollis or crepitus. Pain with movement and muscular tenderness present. No spinous process tenderness. Normal range of motion.  Neurological:     General: No focal deficit present.     Mental Status: She is alert and oriented to person, place, and time.  Psychiatric:        Mood and Affect: Mood normal.        Behavior: Behavior normal.      UC Treatments / Results  Labs (all labs ordered are listed, but only abnormal results are displayed) Labs Reviewed - No data to display    EKG   Radiology No results found.  Procedures Procedures (including critical care time)  Medications Ordered in UC Medications  ketorolac  (TORADOL ) 30 MG/ML injection 30 mg (30 mg Intramuscular Given 12/05/23 1408)    Initial Impression / Assessment and Plan / UC Course  I have reviewed the triage vital signs and the nursing notes.  Pertinent labs & imaging results that were available during my care of the patient were reviewed by me and considered in my medical decision making (see chart for details).     Reviewed exam and symptoms with patient.  No red flags.  Patient presenting with cervical radiculopathy symptoms with a history of this.  Patient was given Toradol  injection in clinic.  She was monitored for 10 minutes after injection with no reaction noted and tolerated well.  She was advised no NSAIDs for 24 hours and verbalized understanding.  Will do trial  of muscle relaxer and prednisone , side effect profile reviewed.  Highly encouraged her to follow-up with [facility] for this chronic problem and or see her PCP for further workup and treatment.  Strict ER precautions reviewed and patient verbalized understanding. Final Clinical Impressions(s) / UC Diagnoses   Final diagnoses:  Cervical radiculopathy     Discharge Instructions      You were given a Toradol  injection in clinic today. Do not take any over the counter NSAID's such as Advil , ibuprofen , Aleve , or naproxen  for 24 hours. You may take tylenol  if needed.  Start prednisone  as prescribed.  You may take Flexeril  as needed.  Please of this medication can make you drowsy.  Do not drink alcohol or drive while on this medication.  Please follow-up with [facility] or your PCP in 2 to 3 days for recheck.  Please go to the ER for any worsening symptoms.  I hope you feel better soon!      ED Prescriptions     Medication Sig Dispense Auth. Provider   cyclobenzaprine  (FLEXERIL ) 10 MG tablet Take 1 tablet (10 mg total) by mouth 2 (two) times daily  as needed for muscle spasms. 10 tablet Gabrella Stroh, Jodi R, NP   predniSONE  (STERAPRED UNI-PAK 21 TAB) 10 MG (21) TBPK tablet Take by mouth daily. Take 6 tabs by mouth daily  for 1 day, then 5 tabs for 1 day, then 4 tabs for 1 day, then 3 tabs for 1 day, 2 tabs for 1 day, then 1 tab by mouth daily for 1 days 21 tablet Jarick Harkins, Jodi R, NP      PDMP not reviewed this encounter.   Loreda Myla SAUNDERS, NP 12/05/23 [phone]

## 2023-12-05 NOTE — ED Triage Notes (Signed)
 Pt presents with c/o rt arm and hand pain x 4 days. States it has been hard to grip onto things.

## 2023-12-12 ENCOUNTER — Ambulatory Visit: Payer: 59 | Admitting: Family Medicine

## 2023-12-12 VITALS — BP 114/78 | HR 92 | Temp 97.8°F | Wt 158.6 lb

## 2023-12-12 DIAGNOSIS — M79601 Pain in right arm: Secondary | ICD-10-CM | POA: Insufficient documentation

## 2023-12-12 MED ORDER — METHYLPREDNISOLONE SODIUM SUCC 40 MG IJ SOLR
40.0000 mg | Freq: Once | INTRAMUSCULAR | Status: AC
Start: 2023-12-12 — End: 2023-12-12
  Administered 2023-12-12: 40 mg via INTRAMUSCULAR

## 2023-12-12 NOTE — Assessment & Plan Note (Signed)
 Patient with a 2-week history of hand and wrist pain accompanied by weakness and tremor when grasping objects. Symptoms interfere with daily activities and occupational duties. History of cervical disc disease.   Differential Diagnosis:  Cervical Radiculopathy: Possible nerve root compression given history of cervical disc disease and neurological symptoms. Carpal Tunnel Syndrome: Compression of the median nerve causing pain and weakness. Cubital Tunnel Syndrome: Ulnar nerve compression leading to similar symptoms.  Plan: Administered methylprednisolone  40 mg intramuscular injection today to reduce inflammation. Instructed patient to begin prescribed prednisone  course tomorrow, taking with food to minimize gastrointestinal side effects. Advised patient to avoid NSAIDs such as ibuprofen  and naproxen  to prevent stomach irritation. Placed referral to Orthopedics for further evaluation and management. Placed referral to Physical Therapy for hand and wrist rehabilitation. Return to clinic if symptoms worsen or new symptoms develop.

## 2023-12-12 NOTE — Progress Notes (Signed)
 Assessment/Plan:   Problem List Items Addressed This Visit       Other   Right arm pain - Primary   Patient with a 2-week history of hand and wrist pain accompanied by weakness and tremor when grasping objects. Symptoms interfere with daily activities and occupational duties. History of cervical disc disease.   Differential Diagnosis:  Cervical Radiculopathy: Possible nerve root compression given history of cervical disc disease and neurological symptoms. Carpal Tunnel Syndrome: Compression of the median nerve causing pain and weakness. Cubital Tunnel Syndrome: Ulnar nerve compression leading to similar symptoms.  Plan: Administered methylprednisolone  40 mg intramuscular injection today to reduce inflammation. Instructed patient to begin prescribed prednisone  course tomorrow, taking with food to minimize gastrointestinal side effects. Advised patient to avoid NSAIDs such as ibuprofen  and naproxen  to prevent stomach irritation. Placed referral to Orthopedics for further evaluation and management. Placed referral to Physical Therapy for hand and wrist rehabilitation. Return to clinic if symptoms worsen or new symptoms develop.      Relevant Orders   Ambulatory referral to Orthopedic Surgery   Ambulatory referral to Physical Therapy    There are no discontinued medications.  Return if symptoms worsen or fail to improve.    Subjective:   Encounter date: 12/12/2023  Sarah Greene is a 38 y.o. female who has Atypical chest pain; Depression, major, single episode, moderate (HCC); GAD (generalized anxiety disorder); Smoking; and Right arm pain on their problem list..   She  has a past medical history of Anxiety (12/22/2017), Arthritis, Chronic cervical pain, Chronic lower back pain, Depression (02/03/2022), Family history of adverse reaction to anesthesia, Fibroids (05/19/2018), Migraines, Pneumonia (2005), PONV (postoperative nausea and vomiting), and Sleep apnea (2019)..    Chief Complaint:  Hand and wrist pain.  History of Present Illness:  Patient presents with a 2-week history of hand and wrist pain without any known injury. The pain involves the entire hand and wrist, including both the palm and dorsal sides, and radiates to the fingers. It is constant, aching in nature, and worsens with sudden movements, gripping objects, and at rest, particularly at night. 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  Patient visited urgent care and received a Toradol  injection and was prescribed a muscle relaxer and prednisone , which has not been started yet. The Toradol  injection did not provide relief. Urgent care suggested the possibility of a pinched nerve. Patient has a history of neck problems due to cervical disc disease but notes that neck symptoms have not been severe recently.  Review of Systems:  Musculoskeletal: Positive for hand and wrist pain, hand weakness, and tremor when grasping objects. Neurological: Positive for hand weakness and tremor. All other systems reviewed and are negative.    Past Surgical History:  Procedure Laterality Date   ABDOMINAL HYSTERECTOMY     HYSTERECTOMY ABDOMINAL WITH SALPINGECTOMY Bilateral 08/12/2018   Procedure: HYSTERECTOMY ABDOMINAL WITH SALPINGECTOMY;  Surgeon: Starla Harland BROCKS, MD;  Location: [facility];  Service: Gynecology;  Laterality: Bilateral;   WISDOM TOOTH EXTRACTION      Outpatient Medications Prior to Visit  Medication Sig Dispense Refill   benzonatate  (TESSALON ) 100 MG capsule Take 1 capsule (100 mg total) by mouth 3 (three) times daily as needed for cough. 30 capsule 0   buPROPion  (WELLBUTRIN  XL) 150 MG 24 hr tablet TAKE 1 TABLET(150 MG) BY MOUTH DAILY 30 tablet 0  cyclobenzaprine  (FLEXERIL ) 10 MG tablet Take 1 tablet (10 mg total) by mouth 2 (two) times  daily as needed for muscle spasms. 10 tablet 0   fluconazole  (DIFLUCAN ) 150 MG tablet Take 1 tablet PO once. Repeat in 3 days if needed. 2 tablet 0   fluticasone  (FLONASE ) 50 MCG/ACT nasal spray Place 1 spray into both nostrils daily. Begin by using 2 sprays in each nare daily for 3 to 5 days, then decrease to 1 spray in each nare daily. 15.8 mL 2   lidocaine  (XYLOCAINE ) 5 % ointment Apply 1 Application topically 4 (four) times daily as needed. 35.44 g 0   Multiple Vitamins-Minerals (MULTIVITAMIN GUMMIES ADULT) CHEW Chew 2 tablets by mouth daily.     promethazine -dextromethorphan (PROMETHAZINE -DM) 6.25-15 MG/5ML syrup Take 5 mLs by mouth at bedtime as needed. 118 mL 0   cetirizine  (ZYRTEC  ALLERGY) 10 MG tablet Take 1 tablet (10 mg total) by mouth at bedtime. 90 tablet 1   predniSONE  (STERAPRED UNI-PAK 21 TAB) 10 MG (21) TBPK tablet Take by mouth daily. Take 6 tabs by mouth daily  for 1 day, then 5 tabs for 1 day, then 4 tabs for 1 day, then 3 tabs for 1 day, 2 tabs for 1 day, then 1 tab by mouth daily for 1 days (Patient not taking: Reported on 12/12/2023) 21 tablet 0   No facility-administered medications prior to visit.    Family History  Problem Relation Age of Onset   Hypertension Mother    Arthritis Mother    ADD / ADHD Mother    Hypertension Other    Diabetes Other    Asthma Brother    Alcohol abuse Maternal Uncle    Depression Maternal Aunt    Drug abuse Maternal Aunt    Drug abuse Maternal Uncle    Intellectual disability Maternal Aunt     Social History   Socioeconomic History   Marital status: Significant Other    Spouse name: Not on file   Number of children: Not on file   Years of education: Not on file   Highest education level: Bachelor's degree (e.g., BA, AB, BS)  Occupational History   Not on file  Tobacco Use   Smoking status: Every Day    Current packs/day: 0.50    Average packs/day: 0.5 packs/day for 23.0 years (11.5 ttl pk-yrs)    Types: Cigarettes    Start  date: 2002    Passive exposure: Never   Smokeless tobacco: Never  Vaping Use   Vaping status: Never Used  Substance and Sexual Activity   Alcohol use: Yes    Alcohol/week: 17.0 standard drinks of alcohol    Types: 5 Glasses of wine, 12 Cans of beer per week   Drug use: No   Sexual activity: Yes    Birth control/protection: Surgical    Comment: oral sex  Other Topics Concern   Not on file  Social History Narrative   Not on file   Social Drivers of Health   Financial Resource Strain: Low Risk  (12/12/2023)   Overall Financial Resource Strain (CARDIA)    Difficulty of Paying Living Expenses: Not very hard  Food Insecurity: No Food Insecurity (12/12/2023)   Hunger Vital Sign    Worried About Running Out of Food in the Last Year: Never true    Ran Out of Food in the Last Year: Never true  Transportation Needs: No Transportation Needs (12/12/2023)   PRAPARE - Administrator, Civil Service (Medical): No  Lack of Transportation (Non-Medical): No  Physical Activity: Unknown (12/12/2023)   Exercise Vital Sign    Days of Exercise per Week: 0 days    Minutes of Exercise per Session: Not on file  Stress: Stress Concern Present (12/12/2023)   [employer] of Occupational Health - Occupational Stress Questionnaire    Feeling of Stress : To some extent  Social Connections: Unknown (12/12/2023)   Social Connection and Isolation Panel [NHANES]    Frequency of Communication with Friends and Family: Twice a week    Frequency of Social Gatherings with Friends and Family: Twice a week    Attends Religious Services: Never    Database Administrator or Organizations: No    Attends Engineer, Structural: Not on file    Marital Status: Patient declined  Catering Manager Violence: Not on file                                                                                                  Objective:  Physical Exam: BP 114/78   Pulse 92   Temp 97.8 F (36.6 C) (Temporal)    Wt 158 lb 9.6 oz (71.9 kg)   LMP 08/06/2018   SpO2 98%   BMI 24.12 kg/m     Physical Exam Constitutional:      General: She is not in acute distress.    Appearance: Normal appearance. She is not ill-appearing or toxic-appearing.  HENT:     Head: Normocephalic and atraumatic.     Nose: Nose normal. No congestion.  Eyes:     General: No scleral icterus.    Extraocular Movements: Extraocular movements intact.  Cardiovascular:     Rate and Rhythm: Normal rate and regular rhythm.     Pulses: Normal pulses.     Heart sounds: Normal heart sounds.  Pulmonary:     Effort: Pulmonary effort is normal. No respiratory distress.     Breath sounds: Normal breath sounds.  Abdominal:     General: Abdomen is flat. Bowel sounds are normal.     Palpations: Abdomen is soft.  Musculoskeletal:        General: Normal range of motion.     Right forearm: No swelling or tenderness.     Right wrist: Tenderness present.     Right hand: Decreased strength of finger abduction, thumb/finger opposition and wrist extension.  Lymphadenopathy:     Cervical: No cervical adenopathy.  Skin:    General: Skin is warm and dry.     Findings: No rash.  Neurological:     General: No focal deficit present.     Mental Status: She is alert and oriented to person, place, and time. Mental status is at baseline.  Psychiatric:        Mood and Affect: Mood normal.        Behavior: Behavior normal.        Thought Content: Thought content normal.        Judgment: Judgment normal.     DG Chest 2 View Result Date: 10/03/2023 CLINICAL DATA:  Cough for 2 weeks. EXAM: CHEST -  2 VIEW COMPARISON:  07/05/2022 FINDINGS: The cardiomediastinal contours are normal. The lungs are clear. Pulmonary vasculature is normal. No consolidation, pleural effusion, or pneumothorax. No acute osseous abnormalities are seen. IMPRESSION: Negative radiographs of the chest. Electronically Signed   By: Andrea Gasman M.D.   On: 10/03/2023 17:09     No results found for this or any previous visit (from the past 2160 hours).      Beverley Adine Hummer, MD, MS

## 2023-12-12 NOTE — Patient Instructions (Signed)
-   You will receive a shot of methylprednisolone  today to help reduce inflammation and pain. - Begin taking the prednisone  prescribed by urgent care starting tomorrow; always take it with food to protect your stomach. - Avoid using ibuprofen  or naproxen , as they can irritate your stomach. - A referral to orthopedics and physical therapy will be made for further evaluation and treatment. - Monitor your symptoms and inform us  if your pain persists or if you experience any side effects from the medications.

## 2023-12-20 ENCOUNTER — Telehealth: Payer: Self-pay | Admitting: Family Medicine

## 2023-12-20 NOTE — Telephone Encounter (Signed)
Copied from CRM [id number]. Topic: Referral - Question >> Dec 20, 2023  9:30 AM Sarah Greene wrote: Reason for CRM: Patient called in & stated she wanted to get a referral for orthopedic and not rehab // please call [phone]

## 2023-12-23 NOTE — Telephone Encounter (Signed)
I called and left patient a voicemail that a referral for orthopedics was placed.

## 2024-01-15 ENCOUNTER — Ambulatory Visit
Admission: RE | Admit: 2024-01-15 | Discharge: 2024-01-15 | Disposition: A | Discharge status: Home / Self Care | Payer: 59 | Source: Ambulatory Visit | Attending: Family Medicine | Admitting: Family Medicine

## 2024-01-15 VITALS — BP 109/70 | HR 91 | Temp 98.0°F | Resp 17

## 2024-01-15 DIAGNOSIS — J208 Acute bronchitis due to other specified organisms: Secondary | ICD-10-CM | POA: Diagnosis not present

## 2024-01-15 DIAGNOSIS — B9689 Other specified bacterial agents as the cause of diseases classified elsewhere: Secondary | ICD-10-CM | POA: Diagnosis not present

## 2024-01-15 MED ORDER — FLUCONAZOLE 150 MG PO TABS
150.0000 mg | ORAL_TABLET | Freq: Every day | ORAL | 0 refills | Status: DC
Start: 2024-01-15 — End: 2024-02-03

## 2024-01-15 MED ORDER — AMOXICILLIN-POT CLAVULANATE 875-125 MG PO TABS
1.0000 | ORAL_TABLET | Freq: Two times a day (BID) | ORAL | 0 refills | Status: DC
Start: 2024-01-15 — End: 2024-02-03

## 2024-01-15 MED ORDER — PREDNISONE 20 MG PO TABS
40.0000 mg | ORAL_TABLET | Freq: Every day | ORAL | 0 refills | Status: AC
Start: 2024-01-15 — End: 2024-01-20

## 2024-01-15 NOTE — ED Triage Notes (Signed)
Pt presents with a persistent cough X 3 wks. States it has  not gotten away and has developed a sore throat.

## 2024-01-15 NOTE — ED Provider Notes (Signed)
[facility]    CSN: [id number] Arrival date & time: 01/15/24  0948      History   Chief Complaint Chief Complaint  Patient presents with   Cough    Entered by patient    HPI Sarah Greene is a 38 y.o. female  presents for evaluation of URI symptoms for 3 weeks. Patient reports associated symptoms of productive cough and sore throat from coughing. Denies N/V/D, fevers, ear pain, body aches, shortness of breath. Patient does not have a hx of asthma. Patient is not  active smoker.   States she went to a different urgent care 3 days ago and was given Tessalon and promethazine with no improvement in symptoms.  Pt has taken nothing OTC for symptoms. Pt has no other concerns at this time.    Cough Associated symptoms: sore throat     Past Medical History:  Diagnosis Date   Anxiety 12/22/2017   Arthritis    In my back   Chronic cervical pain    Chronic lower back pain    Depression 02/03/2022   Family history of adverse reaction to anesthesia    severe nausea and vomiting   Fibroids 05/19/2018   Migraines    Pneumonia 2005   3 episodes   PONV (postoperative nausea and vomiting)    Sleep apnea 2019    Patient Active Problem List   Diagnosis Date Noted   Right arm pain 12/12/2023   Atypical chest pain 02/12/2023   Depression, major, single episode, moderate (HCC) 02/12/2023   GAD (generalized anxiety disorder) 02/12/2023   Smoking 02/12/2023    Past Surgical History:  Procedure Laterality Date   ABDOMINAL HYSTERECTOMY     HYSTERECTOMY ABDOMINAL WITH SALPINGECTOMY Bilateral 08/12/2018   Procedure: HYSTERECTOMY ABDOMINAL WITH SALPINGECTOMY;  Surgeon: Allie Bossier, MD;  Location: [facility];  Service: Gynecology;  Laterality: Bilateral;   WISDOM TOOTH EXTRACTION      OB History     Gravida  0   Para  0   Term  0   Preterm  0   AB  0   Living  0      SAB  0   IAB  0   Ectopic  0   Multiple  0   Live Births  0            Home  Medications    Prior to Admission medications   Medication Sig Start Date End Date Taking? Authorizing Provider  amoxicillin-clavulanate (AUGMENTIN) 875-125 MG tablet Take 1 tablet by mouth every 12 (twelve) hours. 01/15/24  Yes Radford Pax, NP  fluconazole (DIFLUCAN) 150 MG tablet Take 1 tablet (150 mg total) by mouth daily. 01/15/24  Yes Radford Pax, NP  predniSONE (DELTASONE) 20 MG tablet Take 2 tablets (40 mg total) by mouth daily with breakfast for 5 days. 01/15/24 01/20/24 Yes Radford Pax, NP  benzonatate (TESSALON) 100 MG capsule Take 1 capsule (100 mg total) by mouth 3 (three) times daily as needed for cough. 09/21/23   Waldon Merl, PA-C  buPROPion (WELLBUTRIN XL) 150 MG 24 hr tablet TAKE 1 TABLET(150 MG) BY MOUTH DAILY 03/16/23   Garnette Gunner, MD  cetirizine (ZYRTEC ALLERGY) 10 MG tablet Take 1 tablet (10 mg total) by mouth at bedtime. 04/21/23 10/18/23  Theadora Rama Scales, PA-C  cyclobenzaprine (FLEXERIL) 10 MG tablet Take 1 tablet (10 mg total) by mouth 2 (two) times daily as needed for muscle spasms. 12/05/23   Stacie Acres,  Hipolito Bayley, NP  fluticasone (FLONASE) 50 MCG/ACT nasal spray Place 1 spray into both nostrils daily. Begin by using 2 sprays in each nare daily for 3 to 5 days, then decrease to 1 spray in each nare daily. 04/21/23   Theadora Rama Scales, PA-C  lidocaine (XYLOCAINE) 5 % ointment Apply 1 Application topically 4 (four) times daily as needed. 04/21/23   Theadora Rama Scales, PA-C  Multiple Vitamins-Minerals (MULTIVITAMIN GUMMIES ADULT) CHEW Chew 2 tablets by mouth daily.    [provider]  promethazine-dextromethorphan (PROMETHAZINE-DM) 6.25-15 MG/5ML syrup Take 5 mLs by mouth at bedtime as needed. 10/03/23   Carlisle Beers, FNP    Family History Family History  Problem Relation Age of Onset   Hypertension Mother    Arthritis Mother    ADD / ADHD Mother    Hypertension Other    Diabetes Other    Asthma Brother    Alcohol abuse Maternal  Uncle    Depression Maternal Aunt    Drug abuse Maternal Aunt    Drug abuse Maternal Uncle    Intellectual disability Maternal Aunt     Social History Social History   Tobacco Use   Smoking status: Every Day    Current packs/day: 0.50    Average packs/day: 0.5 packs/day for 23.1 years (11.6 ttl pk-yrs)    Types: Cigarettes    Start date: 2002    Passive exposure: Never   Smokeless tobacco: Never  Vaping Use   Vaping status: Never Used  Substance Use Topics   Alcohol use: Yes    Alcohol/week: 17.0 standard drinks of alcohol    Types: 5 Glasses of wine, 12 Cans of beer per week   Drug use: No     Allergies   Patient has no known allergies.   Review of Systems Review of Systems  HENT:  Positive for sore throat.   Respiratory:  Positive for cough.      Physical Exam Triage Vital Signs ED Triage Vitals  Encounter Vitals Group     BP 01/15/24 1001 109/70     Systolic BP Percentile --      Diastolic BP Percentile --      Pulse Rate 01/15/24 1001 91     Resp 01/15/24 1001 17     Temp 01/15/24 1001 98 F (36.7 C)     Temp Source 01/15/24 1001 Oral     SpO2 01/15/24 1001 96 %     Weight --      Height --      Head Circumference --      Peak Flow --      Pain Score 01/15/24 0959 0     Pain Loc --      Pain Education --      Exclude from Growth Chart --    No data found.  Updated Vital Signs BP 109/70 (BP Location: Right Arm)   Pulse 91   Temp 98 F (36.7 C) (Oral)   Resp 17   LMP 08/06/2018   SpO2 96%   Visual Acuity Right Eye Distance:   Left Eye Distance:   Bilateral Distance:    Right Eye Near:   Left Eye Near:    Bilateral Near:     Physical Exam Vitals and nursing note reviewed.  Constitutional:      General: She is not in acute distress.    Appearance: She is well-developed. She is not ill-appearing.  HENT:     Head: Normocephalic and atraumatic.  Right Ear: Tympanic membrane and ear canal normal.     Left Ear: Tympanic membrane  and ear canal normal.     Nose: Congestion present.     Mouth/Throat:     Mouth: Mucous membranes are moist.     Pharynx: Oropharynx is clear. Uvula midline. No oropharyngeal exudate or posterior oropharyngeal erythema.     Tonsils: No tonsillar exudate or tonsillar abscesses.  Eyes:     Conjunctiva/sclera: Conjunctivae normal.     Pupils: Pupils are equal, round, and reactive to light.  Cardiovascular:     Rate and Rhythm: Normal rate and regular rhythm.     Heart sounds: Normal heart sounds.  Pulmonary:     Effort: Pulmonary effort is normal.     Breath sounds: Normal breath sounds. No wheezing or rhonchi.  Musculoskeletal:     Cervical back: Normal range of motion and neck supple.  Lymphadenopathy:     Cervical: No cervical adenopathy.  Skin:    General: Skin is warm and dry.  Neurological:     General: No focal deficit present.     Mental Status: She is alert and oriented to person, place, and time.  Psychiatric:        Mood and Affect: Mood normal.        Behavior: Behavior normal.      UC Treatments / Results  Labs (all labs ordered are listed, but only abnormal results are displayed) Labs Reviewed - No data to display  EKG   Radiology No results found.  Procedures Procedures (including critical care time)  Medications Ordered in UC Medications - No data to display  Initial Impression / Assessment and Plan / UC Course  I have reviewed the triage vital signs and the nursing notes.  Pertinent labs & imaging results that were available during my care of the patient were reviewed by me and considered in my medical decision making (see chart for details).     Reviewed exam and symptoms with patient.  No red flags.  Start Augmentin given length of symptoms.  Patient reports antibiotic induced yeast infection, Diflucan provided.  Prednisone daily for 5 days.  She may continue the previously prescribed cough medicine as needed.  PCP follow-up as symptoms do not  improve.  ER precautions reviewed. Final Clinical Impressions(s) / UC Diagnoses   Final diagnoses:  Acute bacterial bronchitis     Discharge Instructions      Start Augmentin twice daily for 7 days.  You may take Diflucan on day 3 of antibiotics to help prevent yeast infection.  Prednisone daily for 5 days.  Lots of rest and fluids.  You may continue the previously prescribed cough medicine you were given as needed.  Please follow-up with your PCP if your symptoms do not improve.  Please go to the ER for any worsening symptoms.  Hope you feel better soon!    ED Prescriptions     Medication Sig Dispense Auth. Provider   amoxicillin-clavulanate (AUGMENTIN) 875-125 MG tablet Take 1 tablet by mouth every 12 (twelve) hours. 14 tablet Radford Pax, NP   fluconazole (DIFLUCAN) 150 MG tablet Take 1 tablet (150 mg total) by mouth daily. 1 tablet Radford Pax, NP   predniSONE (DELTASONE) 20 MG tablet Take 2 tablets (40 mg total) by mouth daily with breakfast for 5 days. 10 tablet Radford Pax, NP      PDMP not reviewed this encounter.   Radford Pax, NP 01/15/24 1025

## 2024-01-15 NOTE — Discharge Instructions (Addendum)
Start Augmentin twice daily for 7 days.  You may take Diflucan on day 3 of antibiotics to help prevent yeast infection.  Prednisone daily for 5 days.  Lots of rest and fluids.  You may continue the previously prescribed cough medicine you were given as needed.  Please follow-up with your PCP if your symptoms do not improve.  Please go to the ER for any worsening symptoms.  Hope you feel better soon!

## 2024-02-03 ENCOUNTER — Ambulatory Visit
Admission: RE | Admit: 2024-02-03 | Discharge: 2024-02-03 | Disposition: A | Discharge status: Home / Self Care | Source: Ambulatory Visit | Attending: Family Medicine | Admitting: Family Medicine

## 2024-02-03 ENCOUNTER — Other Ambulatory Visit: Payer: Self-pay

## 2024-02-03 ENCOUNTER — Ambulatory Visit (INDEPENDENT_AMBULATORY_CARE_PROVIDER_SITE_OTHER): Admitting: Radiology

## 2024-02-03 VITALS — BP 111/66 | HR 83 | Temp 98.2°F | Resp 18 | Ht 68.0 in | Wt 160.0 lb

## 2024-02-03 DIAGNOSIS — R052 Subacute cough: Secondary | ICD-10-CM | POA: Diagnosis not present

## 2024-02-03 NOTE — ED Triage Notes (Signed)
 Pt presents with complaints of chest tightness and dry cough x 1 month. Pt was seen on 2/12 at [facility] and was prescribed steroid & antibiotics. Completed each prescription however symptoms are still ongoing. Pt currently rates her overall discomfort a 6/10.

## 2024-02-03 NOTE — ED Provider Notes (Signed)
 [facility]    CSN: [id number] Arrival date & time: 02/03/24  9562      History   Chief Complaint Chief Complaint  Patient presents with   Cough    Chest feels heavy - Entered by patient    HPI Sarah Greene is a 38 y.o. female.   The history is provided by the patient.  Cough Cough for approximately 5 weeks, nonproductive, has had some chest tightness and he feels short of breath with activity.  Has been seen at various facilities for same, was treated with Tessalon Perles, Promethazine DM neuroid's had an antibiotic.  Admits fever congestion rhinorrhea at onset of illness which resolved.  Admits family members with cough.  Denies recent travel, history of asthma, chest pain, abdominal pain, lower extremity pain or swelling, palpitations or dizziness, wheezing. Admits recent heartburn.  Denies history of GERD. She has had a hysterectomy.  She is a smoker.  Past Medical History:  Diagnosis Date   Anxiety 12/22/2017   Arthritis    In my back   Chronic cervical pain    Chronic lower back pain    Depression 02/03/2022   Family history of adverse reaction to anesthesia    severe nausea and vomiting   Fibroids 05/19/2018   Migraines    Pneumonia 2005   3 episodes   PONV (postoperative nausea and vomiting)    Sleep apnea 2019    Patient Active Problem List   Diagnosis Date Noted   Right arm pain 12/12/2023   Atypical chest pain 02/12/2023   Depression, major, single episode, moderate (HCC) 02/12/2023   GAD (generalized anxiety disorder) 02/12/2023   Smoking 02/12/2023    Past Surgical History:  Procedure Laterality Date   ABDOMINAL HYSTERECTOMY     HYSTERECTOMY ABDOMINAL WITH SALPINGECTOMY Bilateral 08/12/2018   Procedure: HYSTERECTOMY ABDOMINAL WITH SALPINGECTOMY;  Surgeon: Allie Bossier, MD;  Location: [facility];  Service: Gynecology;  Laterality: Bilateral;   WISDOM TOOTH EXTRACTION      OB History     Gravida  0   Para  0   Term  0   Preterm   0   AB  0   Living  0      SAB  0   IAB  0   Ectopic  0   Multiple  0   Live Births  0            Home Medications    Prior to Admission medications   Medication Sig Start Date End Date Taking? Authorizing Provider  amoxicillin-clavulanate (AUGMENTIN) 875-125 MG tablet Take 1 tablet by mouth every 12 (twelve) hours. 01/15/24   Radford Pax, NP  benzonatate (TESSALON) 100 MG capsule Take 1 capsule (100 mg total) by mouth 3 (three) times daily as needed for cough. 09/21/23   Waldon Merl, PA-C  buPROPion (WELLBUTRIN XL) 150 MG 24 hr tablet TAKE 1 TABLET(150 MG) BY MOUTH DAILY 03/16/23   Garnette Gunner, MD  cetirizine (ZYRTEC ALLERGY) 10 MG tablet Take 1 tablet (10 mg total) by mouth at bedtime. 04/21/23 10/18/23  Theadora Rama Scales, PA-C  cyclobenzaprine (FLEXERIL) 10 MG tablet Take 1 tablet (10 mg total) by mouth 2 (two) times daily as needed for muscle spasms. 12/05/23   Radford Pax, NP  fluconazole (DIFLUCAN) 150 MG tablet Take 1 tablet (150 mg total) by mouth daily. 01/15/24   Radford Pax, NP  fluticasone (FLONASE) 50 MCG/ACT nasal spray Place 1 spray into both  nostrils daily. Begin by using 2 sprays in each nare daily for 3 to 5 days, then decrease to 1 spray in each nare daily. 04/21/23   Theadora Rama Scales, PA-C  lidocaine (XYLOCAINE) 5 % ointment Apply 1 Application topically 4 (four) times daily as needed. 04/21/23   Theadora Rama Scales, PA-C  Multiple Vitamins-Minerals (MULTIVITAMIN GUMMIES ADULT) CHEW Chew 2 tablets by mouth daily.    [provider]  promethazine-dextromethorphan (PROMETHAZINE-DM) 6.25-15 MG/5ML syrup Take 5 mLs by mouth at bedtime as needed. 10/03/23   Carlisle Beers, FNP    Family History Family History  Problem Relation Age of Onset   Hypertension Mother    Arthritis Mother    ADD / ADHD Mother    Hypertension Other    Diabetes Other    Asthma Brother    Alcohol abuse Maternal Uncle    Depression Maternal  Aunt    Drug abuse Maternal Aunt    Drug abuse Maternal Uncle    Intellectual disability Maternal Aunt     Social History Social History   Tobacco Use   Smoking status: Every Day    Current packs/day: 0.50    Average packs/day: 0.5 packs/day for 23.2 years (11.6 ttl pk-yrs)    Types: Cigarettes    Start date: 2002    Passive exposure: Never   Smokeless tobacco: Never  Vaping Use   Vaping status: Never Used  Substance Use Topics   Alcohol use: Yes    Alcohol/week: 17.0 standard drinks of alcohol    Types: 5 Glasses of wine, 12 Cans of beer per week   Drug use: No     Allergies   Patient has no known allergies.   Review of Systems Review of Systems  Respiratory:  Positive for cough.      Physical Exam Triage Vital Signs ED Triage Vitals  Encounter Vitals Group     BP 02/03/24 0955 111/66     Systolic BP Percentile --      Diastolic BP Percentile --      Pulse Rate 02/03/24 0955 83     Resp 02/03/24 0955 18     Temp 02/03/24 0955 98.2 F (36.8 C)     Temp Source 02/03/24 0955 Oral     SpO2 02/03/24 0955 96 %     Weight 02/03/24 0953 160 lb (72.6 kg)     Height 02/03/24 0953 5\' 8"  (1.727 m)     Head Circumference --      Peak Flow --      Pain Score 02/03/24 0953 6     Pain Loc --      Pain Education --      Exclude from Growth Chart --    No data found.  Updated Vital Signs BP 111/66 (BP Location: Right Arm)   Pulse 83   Temp 98.2 F (36.8 C) (Oral)   Resp 18   Ht 5\' 8"  (1.727 m)   Wt 160 lb (72.6 kg)   LMP 08/06/2018   SpO2 96%   BMI 24.33 kg/m   Visual Acuity Right Eye Distance:   Left Eye Distance:   Bilateral Distance:    Right Eye Near:   Left Eye Near:    Bilateral Near:     Physical Exam Constitutional:      Appearance: Normal appearance. She is not ill-appearing.  HENT:     Head: Normocephalic and atraumatic.     Right Ear: Tympanic membrane and ear canal normal.  Left Ear: Tympanic membrane normal.     Nose: No  rhinorrhea.     Mouth/Throat:     Mouth: Mucous membranes are moist.  Eyes:     Conjunctiva/sclera: Conjunctivae normal.  Cardiovascular:     Rate and Rhythm: Normal rate.     Heart sounds: Normal heart sounds. No murmur heard. Pulmonary:     Effort: Pulmonary effort is normal. No respiratory distress.     Breath sounds: No wheezing, rhonchi or rales.  Musculoskeletal:     Cervical back: Neck supple.  Lymphadenopathy:     Cervical: No cervical adenopathy.  Skin:    General: Skin is dry.  Neurological:     Mental Status: She is alert.  Psychiatric:        Mood and Affect: Mood normal.      UC Treatments / Results  Labs (all labs ordered are listed, but only abnormal results are displayed) Labs Reviewed - No data to display  EKG   Radiology No results found.  Procedures Procedures (including critical care time)  Medications Ordered in UC Medications - No data to display  Initial Impression / Assessment and Plan / UC Course  I have reviewed the triage vital signs and the nursing notes.  Pertinent labs & imaging results that were available during my care of the patient were reviewed by me and considered in my medical decision making (see chart for details).     39 year old smoker presents with cough for 5 weeks, has been treated with antitussives, antibiotics and steroids with some improvement but not full resolution.  Recent heartburn, has a history of seasonal allergies, recommend patient start her allergy medications and Over-the-counter Pepcid twice daily, follow-up with PCP if cough fails to improve recommend quitting smoking Final Clinical Impressions(s) / UC Diagnoses   Final diagnoses:  Subacute cough   Discharge Instructions   None    ED Prescriptions   None    PDMP not reviewed this encounter.   Meliton Rattan, [place] 02/03/24 1021

## 2024-02-03 NOTE — Discharge Instructions (Addendum)
 Take over the counter allergy medicine such as Zyrtec and Flonase as directed on the package daily.  Recommend over-the-counter Pepcid twice daily, follow-up with your family doctor if symptoms fail to improve  The x-ray reading we discussed is preliminary. Your x-ray will be read by a radiologist in next few hours. If there is a discrepancy, you will be contacted, and instructed on a new plan for you care.

## 2024-03-02 ENCOUNTER — Telehealth: Admitting: Emergency Medicine

## 2024-03-02 ENCOUNTER — Telehealth

## 2024-03-02 DIAGNOSIS — U071 COVID-19: Secondary | ICD-10-CM

## 2024-03-02 MED ORDER — ALBUTEROL SULFATE HFA 108 (90 BASE) MCG/ACT IN AERS: 2.0000 | INHALATION_SPRAY | Freq: Four times a day (QID) | RESPIRATORY_TRACT | 0 refills | Status: DC | PRN

## 2024-03-02 MED ORDER — NIRMATRELVIR/RITONAVIR (PAXLOVID) TABLET (RENAL DOSING): 2.0000 | ORAL_TABLET | Freq: Two times a day (BID) | ORAL | 0 refills | Status: AC

## 2024-03-02 NOTE — Patient Instructions (Signed)
 Sarah Greene, thank you for joining Sarah Parsons, NP for today's virtual visit.  While this provider is not your primary care provider (PCP), if your PCP is located in our provider database this encounter information will be shared with them immediately following your visit.   A [facility] MyChart account gives you access to today's visit and all your visits, tests, and labs performed at [facility] " click here if you don't have a [facility] MyChart account or go to [email or web address]  Consent: (Patient) Sarah Greene provided verbal consent for this virtual visit at the beginning of the encounter.  Current Medications:  Current Outpatient Medications:    albuterol (VENTOLIN HFA) 108 (90 Base) MCG/ACT inhaler, Inhale 2 puffs into the lungs every 6 (six) hours as needed for wheezing or shortness of breath., Disp: 34 g, Rfl: 0   nirmatrelvir/ritonavir, renal dosing, (PAXLOVID) 10 x 150 MG & 10 x 100MG  TABS, Take 2 tablets by mouth 2 (two) times daily for 5 days. (Take nirmatrelvir 150 mg one tablet twice daily for 5 days and ritonavir 100 mg one tablet twice daily for 5 days) Patient GFR is unknown. Last GFR in 07/2022 was normal. Pt elects renal dosed med, Disp: 20 tablet, Rfl: 0   Medications ordered in this encounter:  Meds ordered this encounter  Medications   albuterol (VENTOLIN HFA) 108 (90 Base) MCG/ACT inhaler    Sig: Inhale 2 puffs into the lungs every 6 (six) hours as needed for wheezing or shortness of breath.    Dispense:  34 g    Refill:  0   nirmatrelvir/ritonavir, renal dosing, (PAXLOVID) 10 x 150 MG & 10 x 100MG  TABS    Sig: Take 2 tablets by mouth 2 (two) times daily for 5 days. (Take nirmatrelvir 150 mg one tablet twice daily for 5 days and ritonavir 100 mg one tablet twice daily for 5 days) Patient GFR is unknown. Last GFR in 07/2022 was normal. Pt elects renal dosed med    Dispense:  20 tablet    Refill:  0     *If you need refills on other  medications prior to your next appointment, please contact your pharmacy*  Follow-Up: Call back or seek an in-person evaluation if the symptoms worsen or if the condition fails to improve as anticipated.  [facility] [phone]  Other Instructions  Use tylenol or ibuprofen as directed on the package for pain or fever.  Use Mucinex for congestion, and/or try nasal saline spray to help relieve congestion.   Use the albuterol inhaler if short of breath or wheezing.   You can return to work when you are feeling better but you will need to wear a mask for 10 days from when you first got sick (likely another 5 days after you are feeling better).    If you have been instructed to have an in-person evaluation today at a local Urgent Care facility, please use the link below. It will take you to a list of all of our available [facility], including address, phone number and hours of operation. Please do not delay care.  [facility]  If you or a family member do not have a primary care provider, use the link below to schedule a visit and establish care. When you choose a [facility] primary care physician or advanced practice provider, you gain a long-term partner in health. Find a Primary Care Provider  Learn more about [facility]  in-office and virtual care options: Page - Get Care Now

## 2024-03-02 NOTE — Progress Notes (Signed)
 Virtual Visit Consent   Sarah Greene, you are scheduled for a virtual visit with a [facility] provider today. Just as with appointments in the office, your consent must be obtained to participate. Your consent will be active for this visit and any virtual visit you may have with one of our providers in the next 365 days. If you have a MyChart account, a copy of this consent can be sent to you electronically.  As this is a virtual visit, video technology does not allow for your provider to perform a traditional examination. This may limit your provider's ability to fully assess your condition. If your provider identifies any concerns that need to be evaluated in person or the need to arrange testing (such as labs, EKG, etc.), we will make arrangements to do so. Although advances in technology are sophisticated, we cannot ensure that it will always work on either your end or our end. If the connection with a video visit is poor, the visit may have to be switched to a telephone visit. With either a video or telephone visit, we are not always able to ensure that we have a secure connection.  By engaging in this virtual visit, you consent to the provision of healthcare and authorize for your insurance to be billed (if applicable) for the services provided during this visit. Depending on your insurance coverage, you may receive a charge related to this service.  I need to obtain your verbal consent now. Are you willing to proceed with your visit today? Sarah Greene has provided verbal consent on 03/02/2024 for a virtual visit (video or telephone). Cathlyn Parsons, NP  Date: 03/02/2024 9:41 AM   Virtual Visit via Video Note   I, Cathlyn Parsons, connected with  Sarah Greene  ([id number], [birth year]) on 03/02/24 at  9:30 AM EDT by a video-enabled telemedicine application and verified that I am speaking with the correct person using two identifiers.  Location: Patient: Virtual Visit Location Patient:  Home Provider: Virtual Visit Location Provider: Home Office   I discussed the limitations of evaluation and management by telemedicine and the availability of in person appointments. The patient expressed understanding and agreed to proceed.    History of Present Illness: Sarah Greene is a 38 y.o. who identifies as a female who was assigned female at birth, and is being seen today for covid. STarted sx 02/29/24. Tested positive at home. Is a smoker. C/o coughing, hot flash/hot, congestion, headache, fatigue. Denies SOB.   HPI: HPI  Problems:  Patient Active Problem List   Diagnosis Date Noted   Right arm pain 12/12/2023   Atypical chest pain 02/12/2023   Depression, major, single episode, moderate (HCC) 02/12/2023   GAD (generalized anxiety disorder) 02/12/2023   Smoking 02/12/2023    Allergies: No Known Allergies Medications:  Current Outpatient Medications:    albuterol (VENTOLIN HFA) 108 (90 Base) MCG/ACT inhaler, Inhale 2 puffs into the lungs every 6 (six) hours as needed for wheezing or shortness of breath., Disp: 34 g, Rfl: 0   nirmatrelvir/ritonavir, renal dosing, (PAXLOVID) 10 x 150 MG & 10 x 100MG  TABS, Take 2 tablets by mouth 2 (two) times daily for 5 days. (Take nirmatrelvir 150 mg one tablet twice daily for 5 days and ritonavir 100 mg one tablet twice daily for 5 days) Patient GFR is unknown. Last GFR in 07/2022 was normal. Pt elects renal dosed med, Disp: 20 tablet, Rfl: 0  Observations/Objective: Patient is well-developed, well-nourished in no acute distress.  Resting  comfortably  at home.  Head is normocephalic, atraumatic.  No labored breathing.  Speech is clear and coherent with logical content.  Patient is alert and oriented at baseline.    Assessment and Plan: 1. COVID-19 (Primary)  Discussed supportive care. Pt has not had renal function checked in over 12 months. She elects renal dosed paxlovid but does not have known kidney disease. Discussed when to return to  work, when to wear a mask.   Follow Up Instructions: I discussed the assessment and treatment plan with the patient. The patient was provided an opportunity to ask questions and all were answered. The patient agreed with the plan and demonstrated an understanding of the instructions.  A copy of instructions were sent to the patient via MyChart unless otherwise noted below.    The patient was advised to call back or seek an in-person evaluation if the symptoms worsen or if the condition fails to improve as anticipated.    Cathlyn Parsons, NP

## 2024-04-01 ENCOUNTER — Ambulatory Visit: Admitting: Family Medicine

## 2024-04-01 ENCOUNTER — Encounter: Payer: Self-pay | Admitting: Family Medicine

## 2024-04-01 VITALS — BP 122/76 | HR 80 | Temp 98.0°F | Ht 68.0 in | Wt 162.6 lb

## 2024-04-01 DIAGNOSIS — R5382 Chronic fatigue, unspecified: Secondary | ICD-10-CM

## 2024-04-01 DIAGNOSIS — F17200 Nicotine dependence, unspecified, uncomplicated: Secondary | ICD-10-CM | POA: Diagnosis not present

## 2024-04-01 DIAGNOSIS — Z124 Encounter for screening for malignant neoplasm of cervix: Secondary | ICD-10-CM

## 2024-04-01 DIAGNOSIS — R0683 Snoring: Secondary | ICD-10-CM | POA: Diagnosis not present

## 2024-04-01 DIAGNOSIS — R635 Abnormal weight gain: Secondary | ICD-10-CM

## 2024-04-01 DIAGNOSIS — R0789 Other chest pain: Secondary | ICD-10-CM

## 2024-04-01 DIAGNOSIS — G4700 Insomnia, unspecified: Secondary | ICD-10-CM

## 2024-04-01 DIAGNOSIS — Z1231 Encounter for screening mammogram for malignant neoplasm of breast: Secondary | ICD-10-CM

## 2024-04-01 LAB — COMPREHENSIVE METABOLIC PANEL WITH GFR
ALT: 21 U/L (ref 0–35)
AST: 15 U/L (ref 0–37)
Albumin: 4.1 g/dL (ref 3.5–5.2)
Alkaline Phosphatase: 39 U/L (ref 39–117)
BUN: 11 mg/dL (ref 6–23)
CO2: 28 meq/L (ref 19–32)
Calcium: 8.9 mg/dL (ref 8.4–10.5)
Chloride: 106 meq/L (ref 96–112)
Creatinine, Ser: 0.96 mg/dL (ref 0.40–1.20)
GFR: 74.27 mL/min (ref >60.00)
Glucose, Bld: 84 mg/dL (ref 70–99)
Potassium: 4.1 meq/L (ref 3.5–5.1)
Sodium: 139 meq/L (ref 135–145)
Total Bilirubin: 0.4 mg/dL (ref 0.2–1.2)
Total Protein: 6.4 g/dL (ref 6.0–8.3)

## 2024-04-01 LAB — CBC WITH DIFFERENTIAL/PLATELET
Basophils Absolute: 0 10*3/uL (ref 0.0–0.1)
Basophils Relative: 0.5 % (ref 0.0–3.0)
Eosinophils Absolute: 0.1 10*3/uL (ref 0.0–0.7)
Eosinophils Relative: 1.2 % (ref 0.0–5.0)
HCT: 39.6 % (ref 36.0–46.0)
Hemoglobin: 13.2 g/dL (ref 12.0–15.0)
Lymphocytes Relative: 35.8 % (ref 12.0–46.0)
Lymphs Abs: 1.8 10*3/uL (ref 0.7–4.0)
MCHC: 33.4 g/dL (ref 30.0–36.0)
MCV: 91.2 fl (ref 78.0–100.0)
Monocytes Absolute: 0.6 10*3/uL (ref 0.1–1.0)
Monocytes Relative: 11.3 % (ref 3.0–12.0)
Neutro Abs: 2.6 10*3/uL (ref 1.4–7.7)
Neutrophils Relative %: 51.2 % (ref 43.0–77.0)
Platelets: 158 10*3/uL (ref 150.0–400.0)
RBC: 4.35 Mil/uL (ref 3.87–5.11)
RDW: 13.9 % (ref 11.5–15.5)
WBC: 5 10*3/uL (ref 4.0–10.5)

## 2024-04-01 LAB — TSH: TSH: 0.75 u[IU]/mL (ref 0.35–5.50)

## 2024-04-01 LAB — FOLATE: Folate: 8.1 ng/mL (ref >5.9)

## 2024-04-01 LAB — SEDIMENTATION RATE: Sed Rate: 7 mm/h (ref 0–20)

## 2024-04-01 LAB — HEMOGLOBIN A1C: Hgb A1c MFr Bld: 5.7 % (ref 4.6–6.5)

## 2024-04-01 LAB — VITAMIN B12: Vitamin B-12: 531 pg/mL (ref 211–911)

## 2024-04-01 MED ORDER — TRAZODONE HCL 50 MG PO TABS
25.0000 mg | ORAL_TABLET | Freq: Every evening | ORAL | 3 refills | Status: AC | PRN
Start: 2024-04-01 — End: ?

## 2024-04-01 NOTE — Progress Notes (Signed)
 Assessment/Plan:   Assessment & Plan Fatigue Fatigue since January, potentially linked to sleep disturbances, respiratory issues, or vitamin deficiencies. Differential includes anemia, autoimmune disease, liver or kidney disease, thyroid dysfunction, diabetes, and sleep apnea. Mood disorders such as anxiety and depression may also contribute. Snoring and frequent awakenings suggest possible sleep apnea. - Order blood tests including CBC, inflammatory markers, liver and kidney function tests, thyroid function tests, blood glucose, B12, folate, and vitamin D levels - Refer for sleep study to evaluate for sleep apnea - Prescribe trazodone 25-50 mg as needed for sleep - Advise on sleep hygiene including avoiding caffeine after noon and alcohol after 6 PM - Encourage exercise  Sleep apnea (suspected) Suspected sleep apnea due to loud snoring and frequent awakenings. Family history of sleep apnea present. - Refer for sleep study to evaluate for sleep apnea  Respiratory issues post-COVID Persistent cough and fatigue following COVID-19 infection approximately one month ago. Previous episodes of bronchitis treated with antibiotics and steroids. Symptoms have slightly worsened post-COVID.  Chest pain Intermittent sharp chest pain occurring approximately once a week. Previous evaluations including chest x-rays and EKGs have been unremarkable. No recent EKG since 2023. Consideration for cardiology referral if symptoms persist.  Weight gain Weight gain from 135 lbs to 166 lbs, possibly related to steroid use for respiratory issues. Current weight is 162 lbs, indicating stabilization. Further evaluation to be considered during a physical exam.      There are no discontinued medications.  Return in 4 weeks (on 04/29/2024) for physical (fasting labs), fatigue.    Subjective:   Encounter date: 04/01/2024  Sarah Greene is a 38 y.o. female who has Atypical chest pain; Depression, major, single  episode, moderate (HCC); GAD (generalized anxiety disorder); Smoking; and Right arm pain on their problem list..   She  has a past medical history of Anxiety (12/22/2017), Arthritis, Chronic cervical pain, Chronic lower back pain, Depression (02/03/2022), Family history of adverse reaction to anesthesia, Fibroids (05/19/2018), Migraines, Pneumonia (2005), PONV (postoperative nausea and vomiting), and Sleep apnea (2019)..   She presents with chief complaint of Medical Management of Chronic Issues (Vitamin D levels check and weight . Referral to OBGYN and mammogram ) .   Discussed the use of AI scribe software for clinical note transcription with the patient, who gave verbal consent to proceed.  History of Present Illness Sarah Greene is a 38 year old female who presents with fatigue and weight gain.  She has been experiencing fatigue since January, characterized by difficulty maintaining sleep despite initially falling asleep without issue. She works 12-hour shifts and sometimes naps at work. She has been taking melatonin to aid sleep but continues to struggle with staying asleep. She has also started exercising to improve her sleep but has not noticed significant improvement. Her mood is generally stable, with normal fluctuations, though she worries about bills, which sometimes affects her sleep. No significant mood changes or depression.  She has experienced weight gain, increasing from 135 pounds to 166 pounds. She attributes some of this gain to steroid use for respiratory issues. Her weight was 158 pounds in January and is now 162 pounds. She had COVID about a month ago and was treated with albuterol  and Paxlovid , but not steroids. In February and March, she was treated for bronchitis with amoxicillin  and prednisone . She also received steroids in January for pain related to tennis elbow.  She has a history of respiratory issues, including a persistent cough that worsened slightly after COVID.  She occasionally  experiences sharp chest pain about once a week. She has had multiple chest x-rays and EKGs in the past, all of which were normal. She has not seen a cardiologist. No fevers, chills, or wheezing.  She mentions snoring and has been told she snores loudly. She sometimes wakes up with difficulty breathing. Her mother has sleep apnea. No significant joint swelling, although her knees have been sore, possibly due to weight gain.     04/01/2024    2:54 PM 12/12/2023    2:39 PM 02/12/2023    1:42 PM 09/19/2018   10:14 AM 05/19/2018    9:27 AM  Depression screen PHQ 2/9  Decreased Interest 1 2 3 1  0  Down, Depressed, Hopeless 1 0 2 0 0  PHQ - 2 Score 2 2 5 1  0  Altered sleeping 1 2 2 2 3   Tired, decreased energy 1 2 2 2 2   Change in appetite 0 2 0 0 2  Feeling bad or failure about yourself  1 0 0 0 0  Trouble concentrating 1 1 1  0 0  Moving slowly or fidgety/restless 0 0 2 0 0  Suicidal thoughts 0 0 0 0 0  PHQ-9 Score 6 9 12 5 7   Difficult doing work/chores Not difficult at all Not difficult at all Somewhat difficult        04/01/2024    2:54 PM 12/12/2023    2:39 PM 02/12/2023    1:43 PM 09/19/2018   10:13 AM  GAD 7 : Generalized Anxiety Score  Nervous, Anxious, on Edge 1 1 1  0  Control/stop worrying 1 1 2  0  Worry too much - different things 1 1 2 1   Trouble relaxing 1 1 2 1   Restless 1 0 2 1  Easily annoyed or irritable 0 0 1 0  Afraid - awful might happen 1 1 2  0  Total GAD 7 Score 6 5 12 3   Anxiety Difficulty Not difficult at all Not difficult at all Somewhat difficult          Past Surgical History:  Procedure Laterality Date   ABDOMINAL HYSTERECTOMY     HYSTERECTOMY ABDOMINAL WITH SALPINGECTOMY Bilateral 08/12/2018   Procedure: HYSTERECTOMY ABDOMINAL WITH SALPINGECTOMY;  Surgeon: Ana Balling, MD;  Location: [facility];  Service: Gynecology;  Laterality: Bilateral;   WISDOM TOOTH EXTRACTION      Outpatient Medications Prior to Visit  Medication Sig Dispense  Refill   albuterol  (VENTOLIN  HFA) 108 (90 Base) MCG/ACT inhaler Inhale 2 puffs into the lungs every 6 (six) hours as needed for wheezing or shortness of breath. 34 g 0   No facility-administered medications prior to visit.    Family History  Problem Relation Age of Onset   Hypertension Mother    Arthritis Mother    ADD / ADHD Mother    Hypertension Other    Diabetes Other    Asthma Brother    Alcohol abuse Maternal Uncle    Depression Maternal Aunt    Drug abuse Maternal Aunt    Drug abuse Maternal Uncle    Intellectual disability Maternal Aunt     Social History   Socioeconomic History   Marital status: Significant Other    Spouse name: Not on file   Number of children: Not on file   Years of education: Not on file   Highest education level: Bachelor's degree (e.g., BA, AB, BS)  Occupational History   Not on file  Tobacco Use   Smoking status: Every Day  Current packs/day: 0.50    Average packs/day: 0.5 packs/day for 23.3 years (11.7 ttl pk-yrs)    Types: Cigarettes    Start date: 2002    Passive exposure: Never   Smokeless tobacco: Never  Vaping Use   Vaping status: Never Used  Substance and Sexual Activity   Alcohol use: Yes    Alcohol/week: 17.0 standard drinks of alcohol    Types: 5 Glasses of wine, 12 Cans of beer per week   Drug use: No   Sexual activity: Yes    Birth control/protection: Surgical    Comment: oral sex  Other Topics Concern   Not on file  Social History Narrative   Not on file   Social Drivers of Health   Financial Resource Strain: Low Risk  (12/12/2023)   Overall Financial Resource Strain (CARDIA)    Difficulty of Paying Living Expenses: Not very hard  Food Insecurity: No Food Insecurity (12/12/2023)   Hunger Vital Sign    Worried About Running Out of Food in the Last Year: Never true    Ran Out of Food in the Last Year: Never true  Transportation Needs: No Transportation Needs (12/12/2023)   PRAPARE - Scientist, research (physical sciences) (Medical): No    Lack of Transportation (Non-Medical): No  Physical Activity: Unknown (12/12/2023)   Exercise Vital Sign    Days of Exercise per Week: 0 days    Minutes of Exercise per Session: Not on file  Stress: Stress Concern Present (12/12/2023)   [employer] of Occupational Health - Occupational Stress Questionnaire    Feeling of Stress : To some extent  Social Connections: Unknown (12/12/2023)   Social Connection and Isolation Panel [NHANES]    Frequency of Communication with Friends and Family: Twice a week    Frequency of Social Gatherings with Friends and Family: Twice a week    Attends Religious Services: Never    Database administrator or Organizations: No    Attends Engineer, structural: Not on file    Marital Status: Patient declined  Intimate Partner Violence: Not on file                                                                                                  Objective:  Physical Exam: BP 122/76   Pulse 80   Temp 98 F (36.7 C) (Temporal)   Ht 5\' 8"  (1.727 m)   Wt 162 lb 9.6 oz (73.8 kg)   LMP 08/06/2018   SpO2 97%   BMI 24.72 kg/m   Wt Readings from Last 3 Encounters:  04/01/24 162 lb 9.6 oz (73.8 kg)  02/03/24 160 lb (72.6 kg)  12/12/23 158 lb 9.6 oz (71.9 kg)    Physical Exam MEASUREMENTS: Weight- 162. GENERAL: Alert, cooperative, well developed, no acute distress HEENT: Normocephalic, normal oropharynx, moist mucous membranes CHEST: Clear to auscultation bilaterally, no wheezes, rhonchi, or crackles, lungs normal CARDIOVASCULAR: Normal heart rate and rhythm, S1 and S2 normal without murmurs ABDOMEN: Soft, non-tender, non-distended, without organomegaly, normal bowel sounds EXTREMITIES: No cyanosis or edema NEUROLOGICAL: Cranial nerves  grossly intact, moves all extremities without gross motor or sensory deficit     DG Chest 2 View Result Date: 02/03/2024 CLINICAL DATA:  Cough. EXAM: CHEST - 2 VIEW COMPARISON:   10/03/2023 FINDINGS: The lungs are clear without focal pneumonia, edema, pneumothorax or pleural effusion. The cardiopericardial silhouette is within normal limits for size. No acute bony abnormality. IMPRESSION: No active cardiopulmonary disease. Electronically Signed   By: Donnal Fusi M.D.   On: 02/03/2024 11:10    No results found for this or any previous visit (from the past 2160 hours).      Carnell Christian, MD, MS

## 2024-04-01 NOTE — Patient Instructions (Addendum)
  VISIT SUMMARY: During your visit, we discussed your ongoing fatigue, weight gain, and respiratory issues. We also addressed your sleep disturbances and potential sleep apnea. We reviewed your history of respiratory problems and recent COVID-19 infection, as well as your intermittent chest pain.  YOUR PLAN: -FATIGUE: Fatigue can be caused by various factors including sleep disturbances, respiratory issues, or vitamin deficiencies. We will conduct blood tests to check for anemia, autoimmune disease, liver or kidney disease, thyroid dysfunction, diabetes, and vitamin deficiencies. You are also referred for a sleep study to evaluate for sleep apnea. I have prescribed trazodone 25-50 mg as needed to help with sleep. Additionally, I recommend practicing good sleep hygiene, such as avoiding caffeine after noon and alcohol after 6 PM, and continuing to exercise.  -SLEEP APNEA (SUSPECTED): Sleep apnea is a condition where breathing repeatedly stops and starts during sleep. Given your loud snoring and frequent awakenings, along with a family history of sleep apnea, you are referred for a sleep study to confirm this diagnosis.  -RESPIRATORY ISSUES POST-COVID: You have a persistent cough and fatigue following a COVID-19 infection about a month ago. These symptoms have slightly worsened post-COVID. We will continue to monitor your symptoms and consider further evaluation if needed.  -CHEST PAIN: You experience intermittent sharp chest pain about once a week. Previous evaluations including chest x-rays and EKGs have been normal. We are referring you to a cardiologist for further evaluation.  -WEIGHT GAIN: You have gained weight from 135 lbs to 166 lbs, possibly due to steroid use for respiratory issues. Your current weight is 162 lbs, indicating some stabilization. We will continue to monitor your weight and evaluate further during your physical exam.  INSTRUCTIONS: Please complete the blood tests as ordered and  schedule a sleep study to evaluate for sleep apnea. Take trazodone 25-50 mg as needed to help with sleep. Follow the sleep hygiene recommendations provided. If your chest pain persists, we may need to refer you to a cardiologist for further evaluation.                 Contains text generated by [employer].

## 2024-04-02 ENCOUNTER — Other Ambulatory Visit (INDEPENDENT_AMBULATORY_CARE_PROVIDER_SITE_OTHER)

## 2024-04-02 ENCOUNTER — Other Ambulatory Visit: Payer: Self-pay | Admitting: Family Medicine

## 2024-04-02 ENCOUNTER — Encounter: Payer: Self-pay | Admitting: Family Medicine

## 2024-04-02 DIAGNOSIS — R5382 Chronic fatigue, unspecified: Secondary | ICD-10-CM

## 2024-04-02 DIAGNOSIS — E559 Vitamin D deficiency, unspecified: Secondary | ICD-10-CM | POA: Insufficient documentation

## 2024-04-02 LAB — VITAMIN D 25 HYDROXY (VIT D DEFICIENCY, FRACTURES): VITD: 12.61 ng/mL — ABNORMAL LOW (ref 30.00–100.00)

## 2024-04-02 MED ORDER — VITAMIN D (ERGOCALCIFEROL) 1.25 MG (50000 UNIT) PO CAPS: 50000.0000 [IU] | ORAL_CAPSULE | ORAL | 0 refills | Status: DC

## 2024-04-05 LAB — VITAMIN D 1,25 DIHYDROXY
Vitamin D 1, 25 (OH)2 Total: 53 pg/mL (ref 18–72)
Vitamin D2 1, 25 (OH)2: <8 pg/mL
Vitamin D3 1, 25 (OH)2: 53 pg/mL

## 2024-04-07 ENCOUNTER — Encounter: Payer: Self-pay | Admitting: Family Medicine

## 2024-04-14 ENCOUNTER — Encounter: Admitting: Family Medicine

## 2024-05-27 ENCOUNTER — Other Ambulatory Visit: Payer: Self-pay | Admitting: Family Medicine

## 2024-05-27 DIAGNOSIS — E559 Vitamin D deficiency, unspecified: Secondary | ICD-10-CM

## 2024-08-04 ENCOUNTER — Other Ambulatory Visit: Payer: Self-pay

## 2024-08-04 ENCOUNTER — Ambulatory Visit
Admission: RE | Admit: 2024-08-04 | Discharge: 2024-08-04 | Disposition: A | Discharge status: Home / Self Care | Attending: Physician Assistant | Admitting: Physician Assistant

## 2024-08-04 VITALS — BP 123/83 | HR 73 | Temp 98.5°F | Resp 18 | Ht 68.0 in | Wt 160.0 lb

## 2024-08-04 DIAGNOSIS — J209 Acute bronchitis, unspecified: Secondary | ICD-10-CM | POA: Diagnosis not present

## 2024-08-04 DIAGNOSIS — J019 Acute sinusitis, unspecified: Secondary | ICD-10-CM | POA: Diagnosis not present

## 2024-08-04 MED ORDER — PROMETHAZINE-DM 6.25-15 MG/5ML PO SYRP: 5.0000 mL | ORAL_SOLUTION | Freq: Four times a day (QID) | ORAL | 0 refills | Status: DC | PRN

## 2024-08-04 MED ORDER — AMOXICILLIN-POT CLAVULANATE 875-125 MG PO TABS: 1.0000 | ORAL_TABLET | Freq: Two times a day (BID) | ORAL | 0 refills | Status: DC

## 2024-08-04 MED ORDER — PREDNISONE 20 MG PO TABS: ORAL_TABLET | ORAL | 0 refills | Status: DC

## 2024-08-04 NOTE — ED Triage Notes (Addendum)
 Pt presents with complaints of productive cough, nasal congestion, and chills x 3-4 weeks. States she has taken OTC Delsym cough tablet and prescribed tessalon  pearls+inhaler with no improvement. Stopped smoking when symptoms started. Currently denies pain. Symptoms worsen at nighttime. At-home COVID test taken one week ago and it was negative.

## 2024-08-04 NOTE — ED Provider Notes (Signed)
 [facility]    CSN: [id number] Arrival date & time: 08/04/24  1120      History   Chief Complaint Chief Complaint  Patient presents with   Cough    Cough came back chest hurts - Entered by patient   Nasal Congestion    HPI Journee Bobrowski is a 38 y.o. female.   HPI  Patient presents today with concerns for productive coughing that has been ongoing for about 3 weeks.  She states that she has been using Delsym, albuterol  rescue inhaler, Tessalon  Perles to assist with symptoms but this has not provided much relief.  She reports that mucus is yellowish and thick and she sometimes has nausea due to severe coughing spells.  She reports that the coughing seems to get worse at night.  She states that she has been taking the albuterol  inhaler about twice per day but this is not providing much relief.  She reports history of recurrent bronchitis as well as pneumonia when she was younger.  She denies previous history of asthma or COPD  Past Medical History:  Diagnosis Date   Anxiety 12/22/2017   Arthritis    In my back   Chronic cervical pain    Chronic lower back pain    Depression 02/03/2022   Family history of adverse reaction to anesthesia    severe nausea and vomiting   Fibroids 05/19/2018   Migraines    Pneumonia 2005   3 episodes   PONV (postoperative nausea and vomiting)    Sleep apnea 2019    Patient Active Problem List   Diagnosis Date Noted   Hypovitaminosis D 04/02/2024   Right arm pain 12/12/2023   Atypical chest pain 02/12/2023   Depression, major, single episode, moderate (HCC) 02/12/2023   GAD (generalized anxiety disorder) 02/12/2023   Smoking 02/12/2023    Past Surgical History:  Procedure Laterality Date   ABDOMINAL HYSTERECTOMY     HYSTERECTOMY ABDOMINAL WITH SALPINGECTOMY Bilateral 08/12/2018   Procedure: HYSTERECTOMY ABDOMINAL WITH SALPINGECTOMY;  Surgeon: Starla Harland BROCKS, MD;  Location: [facility];  Service: Gynecology;  Laterality: Bilateral;    WISDOM TOOTH EXTRACTION      OB History     Gravida  0   Para  0   Term  0   Preterm  0   AB  0   Living  0      SAB  0   IAB  0   Ectopic  0   Multiple  0   Live Births  0            Home Medications    Prior to Admission medications   Medication Sig Start Date End Date Taking? Authorizing Provider  amoxicillin -clavulanate (AUGMENTIN ) 875-125 MG tablet Take 1 tablet by mouth every 12 (twelve) hours. 08/04/24  Yes Dillinger Aston E, PA-C  predniSONE  (DELTASONE ) 20 MG tablet Take 60mg  PO daily x 2 days, then40mg  PO daily x 2 days, then 20mg  PO daily x 3 days 08/04/24  Yes Delphine Sizemore E, PA-C  promethazine -dextromethorphan (PROMETHAZINE -DM) 6.25-15 MG/5ML syrup Take 5 mLs by mouth 4 (four) times daily as needed. 08/04/24  Yes [place] Lesure E, PA-C  albuterol  (VENTOLIN  HFA) 108 (90 Base) MCG/ACT inhaler Inhale 2 puffs into the lungs every 6 (six) hours as needed for wheezing or shortness of breath. 03/02/24   Richad Jon HERO, NP  traZODone  (DESYREL ) 50 MG tablet Take 0.5-1 tablets (25-50 mg total) by mouth at bedtime as needed for sleep. 04/01/24  Sebastian Beverley NOVAK, MD  Vitamin D , Ergocalciferol , (DRISDOL ) 1.25 MG (50000 UNIT) CAPS capsule TAKE 1 CAPSULE BY MOUTH EVERY 7 DAYS 05/27/24   Sebastian Beverley NOVAK, MD    Family History Family History  Problem Relation Age of Onset   Hypertension Mother    Arthritis Mother    ADD / ADHD Mother    Hypertension Other    Diabetes Other    Asthma Brother    Alcohol abuse Maternal Uncle    Depression Maternal Aunt    Drug abuse Maternal Aunt    Drug abuse Maternal Uncle    Intellectual disability Maternal Aunt     Social History Social History   Tobacco Use   Smoking status: Every Day    Current packs/day: 0.50    Average packs/day: 0.5 packs/day for 23.7 years (11.8 ttl pk-yrs)    Types: Cigarettes    Start date: 2002    Passive exposure: Never   Smokeless tobacco: Never  Vaping Use   Vaping status: Never Used  Substance  Use Topics   Alcohol use: Yes    Alcohol/week: 17.0 standard drinks of alcohol    Types: 5 Glasses of wine, 12 Cans of beer per week   Drug use: No     Allergies   Patient has no known allergies.   Review of Systems Review of Systems  Constitutional:  Negative for chills and fever.  HENT:  Positive for congestion and rhinorrhea.   Respiratory:  Positive for cough, chest tightness and shortness of breath. Negative for wheezing.   Gastrointestinal:  Positive for nausea. Negative for vomiting.     Physical Exam Triage Vital Signs ED Triage Vitals  Encounter Vitals Group     BP 08/04/24 1139 123/83     Girls Systolic BP Percentile --      Girls Diastolic BP Percentile --      Boys Systolic BP Percentile --      Boys Diastolic BP Percentile --      Pulse Rate 08/04/24 1139 73     Resp 08/04/24 1139 18     Temp 08/04/24 1139 98.5 F (36.9 C)     Temp Source 08/04/24 1139 Oral     SpO2 08/04/24 1139 98 %     Weight 08/04/24 1139 160 lb (72.6 kg)     Height 08/04/24 1139 5' 8 (1.727 m)     Head Circumference --      Peak Flow --      Pain Score 08/04/24 1155 0     Pain Loc --      Pain Education --      Exclude from Growth Chart --    No data found.  Updated Vital Signs BP 123/83 (BP Location: Right Arm)   Pulse 73   Temp 98.5 F (36.9 C) (Oral)   Resp 18   Ht 5' 8 (1.727 m)   Wt 160 lb (72.6 kg)   LMP 08/06/2018   SpO2 98%   BMI 24.33 kg/m   Visual Acuity Right Eye Distance:   Left Eye Distance:   Bilateral Distance:    Right Eye Near:   Left Eye Near:    Bilateral Near:     Physical Exam Vitals reviewed.  Constitutional:      General: She is awake. She is not in acute distress.    Appearance: Normal appearance. She is well-developed and well-groomed. She is not ill-appearing or toxic-appearing.  HENT:     Head: Normocephalic and  atraumatic.     Nose: Congestion present.  Cardiovascular:     Rate and Rhythm: Normal rate and regular rhythm.      Pulses: Normal pulses.          Radial pulses are 2+ on the right side and 2+ on the left side.     Heart sounds: Normal heart sounds. No murmur heard.    No friction rub. No gallop.  Pulmonary:     Effort: Pulmonary effort is normal.     Breath sounds: Normal breath sounds. Decreased air movement present. No decreased breath sounds, wheezing, rhonchi or rales.  Musculoskeletal:     Cervical back: Normal range of motion and neck supple.  Lymphadenopathy:     Head:     Right side of head: No submental, submandibular or preauricular adenopathy.     Left side of head: No submental, submandibular or preauricular adenopathy.     Cervical:     Right cervical: No superficial cervical adenopathy.    Left cervical: No superficial cervical adenopathy.     Upper Body:     Right upper body: No supraclavicular adenopathy.     Left upper body: No supraclavicular adenopathy.  Neurological:     Mental Status: She is alert.  Psychiatric:        Behavior: Behavior is cooperative.      UC Treatments / Results  Labs (all labs ordered are listed, but only abnormal results are displayed) Labs Reviewed - No data to display  EKG   Radiology No results found.  Procedures Procedures (including critical care time)  Medications Ordered in UC Medications - No data to display  Initial Impression / Assessment and Plan / UC Course  I have reviewed the triage vital signs and the nursing notes.  Pertinent labs & imaging results that were available during my care of the patient were reviewed by me and considered in my medical decision making (see chart for details).      Final Clinical Impressions(s) / UC Diagnoses   Final diagnoses:  Acute bronchitis, unspecified organism  Acute sinusitis, recurrence not specified, unspecified location   Patient presents today with concerns for productive coughing, nasal congestion that has been ongoing for about 3 weeks.  She reports that this is not  improved with medications that were previously prescribed around the beginning of the year.  Physical exam is notable for mildly decreased air movement bilaterally as well as nasal congestion.  Given chronicity of symptoms as well as exam findings I am concerned for potential bronchitis and sinusitis.  Will start Augmentin  p.o. twice daily x 7 days as well as prednisone  taper to assist with coughing and shortness of breath.  Will provide promethazine -dextromethorphan cough syrup to assist with coughing and associated nausea.  Recommend continued use of OTC medications as needed for further symptomatic relief.  ED and return precautions reviewed and provided in AVS.  Follow-up as needed.    Discharge Instructions      You were seen today for concerns of nasal congestion as well as coughing and shortness of breath.  At this time I suspect you likely have bronchitis as well as a sinus infection.  I am starting you on a medication called Augmentin .  This is an antibiotic that I recommend taking by mouth twice per day for 7 days.  Please finish the entire course of the antibiotic as directed unless you develop an allergic reaction or if a medical provider tells you to stop. I have  sent in a script for Prednisone  taper to be taken in the morning with breakfast per the instructions on the container Remember that steroids can cause sleeplessness, irritability, increased hunger and elevated glucose levels so be mindful of these side effects. They should lessen as you progress to the lower doses of the taper.  To help with your coughing and associated nausea I am sending in a medication called promethazine -dextromethorphan.  This is a cough syrup that can sometimes cause drowsiness so please do not take it if you need to remain alert or drive If you develop any of the following symptoms please go to the ER: Severe difficulty breathing, chest pain, nausea or vomiting that prevent you from eating or drinking, fever  or chills that are not responding to Tylenol  or ibuprofen      ED Prescriptions     Medication Sig Dispense Auth. Provider   amoxicillin -clavulanate (AUGMENTIN ) 875-125 MG tablet Take 1 tablet by mouth every 12 (twelve) hours. 14 tablet Lindie Roberson E, PA-C   predniSONE  (DELTASONE ) 20 MG tablet Take 60mg  PO daily x 2 days, then40mg  PO daily x 2 days, then 20mg  PO daily x 3 days 13 tablet Temprence Rhines E, PA-C   promethazine -dextromethorphan (PROMETHAZINE -DM) 6.25-15 MG/5ML syrup Take 5 mLs by mouth 4 (four) times daily as needed. 118 mL Tria Noguera E, PA-C      PDMP not reviewed this encounter.   Marylene Rocky BRAVO, PA-C 08/04/24 1243

## 2024-08-04 NOTE — Discharge Instructions (Addendum)
 You were seen today for concerns of nasal congestion as well as coughing and shortness of breath.  At this time I suspect you likely have bronchitis as well as a sinus infection.  I am starting you on a medication called Augmentin .  This is an antibiotic that I recommend taking by mouth twice per day for 7 days.  Please finish the entire course of the antibiotic as directed unless you develop an allergic reaction or if a medical provider tells you to stop. I have sent in a script for Prednisone  taper to be taken in the morning with breakfast per the instructions on the container Remember that steroids can cause sleeplessness, irritability, increased hunger and elevated glucose levels so be mindful of these side effects. They should lessen as you progress to the lower doses of the taper.  To help with your coughing and associated nausea I am sending in a medication called promethazine -dextromethorphan.  This is a cough syrup that can sometimes cause drowsiness so please do not take it if you need to remain alert or drive If you develop any of the following symptoms please go to the ER: Severe difficulty breathing, chest pain, nausea or vomiting that prevent you from eating or drinking, fever or chills that are not responding to Tylenol  or ibuprofen 

## 2024-08-11 ENCOUNTER — Ambulatory Visit: Admitting: Nurse Practitioner

## 2024-08-11 ENCOUNTER — Other Ambulatory Visit: Payer: Self-pay | Admitting: Nurse Practitioner

## 2024-08-11 ENCOUNTER — Encounter: Payer: Self-pay | Admitting: Nurse Practitioner

## 2024-08-11 ENCOUNTER — Telehealth: Payer: Self-pay | Admitting: Nurse Practitioner

## 2024-08-11 VITALS — BP 122/78 | HR 80 | Temp 98.3°F | Ht 68.0 in | Wt 167.6 lb

## 2024-08-11 DIAGNOSIS — J209 Acute bronchitis, unspecified: Secondary | ICD-10-CM

## 2024-08-11 DIAGNOSIS — Z720 Tobacco use: Secondary | ICD-10-CM

## 2024-08-11 MED ORDER — BUDESONIDE-FORMOTEROL FUMARATE 160-4.5 MCG/ACT IN AERO: 2.0000 | INHALATION_SPRAY | Freq: Two times a day (BID) | RESPIRATORY_TRACT | 0 refills | Status: DC

## 2024-08-11 MED ORDER — AZITHROMYCIN 250 MG PO TABS: 250.0000 mg | ORAL_TABLET | Freq: Every day | ORAL | 0 refills | Status: DC

## 2024-08-11 MED ORDER — NICOTINE 7 MG/24HR TD PT24
7.0000 mg | MEDICATED_PATCH | Freq: Every day | TRANSDERMAL | 0 refills | Status: AC
Start: 2024-09-10 — End: ?

## 2024-08-11 MED ORDER — IPRATROPIUM-ALBUTEROL 0.5-2.5 (3) MG/3ML IN SOLN
3.0000 mL | Freq: Once | RESPIRATORY_TRACT | Status: AC
Start: 2024-08-11 — End: 2024-08-11
  Administered 2024-08-11: 3 mL via RESPIRATORY_TRACT

## 2024-08-11 MED ORDER — NICOTINE 14 MG/24HR TD PT24
14.0000 mg | MEDICATED_PATCH | Freq: Every day | TRANSDERMAL | 0 refills | Status: AC
Start: 2024-08-11 — End: ?

## 2024-08-11 NOTE — Telephone Encounter (Signed)
 Called and left a voice message asking patient to give me a call back at [place] office at [phone]

## 2024-08-11 NOTE — Telephone Encounter (Signed)
 LVM to CB Inform her about azithromycin  prescription. She is to complete augmentin  and oral prednisone  as prescribed She is to start symbicort  after completion of oral prednisone .  CN

## 2024-08-11 NOTE — Assessment & Plan Note (Addendum)
 Chest and sinus congestion x 3weeks, evaluated by urgent care provider 1week ago, she started augmentin  and oral prednisone  5days ago, use of promethazine  Dm at hs only due to sedating side effect. No fever, no night sweats, but has persistent productive cough in the daytime. Has albuterol  inhaler at home but has not used it in last 1week.  Duoneb administered in office due to wheezing and cough- resolved post treatment. Added azithromycin  Prescription, and  mucinex Dm OVER THE COUNTER for cough in daytime Advised to completed augmentin  and oral prednisone  Start symbicort  after completion of oral prednisone . F/up with pcp in 1week if no improvement

## 2024-08-11 NOTE — Progress Notes (Signed)
 Established Patient Visit  Patient: Sarah Greene   DOB: August 20, 1986   38 y.o. Female  MRN: [id number] Visit Date: 08/11/2024  Subjective:    Chief Complaint  Patient presents with   Cough    Cough for 3 week recently prescribed antibiotics     HPI Tobacco use Started at age 18 Smokes 1pack per week. Never quit in the past  Acute bronchitis Chest and sinus congestion x 3weeks, evaluated by urgent care provider 1week ago, she started augmentin  and oral prednisone  5days ago, use of promethazine  Dm at hs only due to sedating side effect. No fever, no night sweats, but has persistent productive cough in the daytime. Has albuterol  inhaler at home but has not used it in last 1week.  Duoneb administered in office due to wheezing and cough- resolved post treatment. Added azithromycin  Prescription, and  mucinex Dm OVER THE COUNTER for cough in daytime Advised to completed augmentin  and oral prednisone  Start symbicort  after completion of oral prednisone . F/up with pcp in 1week if no improvement  Reviewed medical, surgical, and social history today  Medications: Outpatient Medications Prior to Visit  Medication Sig   albuterol  (VENTOLIN  HFA) 108 (90 Base) MCG/ACT inhaler Inhale 2 puffs into the lungs every 6 (six) hours as needed for wheezing or shortness of breath.   amoxicillin -clavulanate (AUGMENTIN ) 875-125 MG tablet Take 1 tablet by mouth every 12 (twelve) hours.   predniSONE  (DELTASONE ) 20 MG tablet Take 60mg  PO daily x 2 days, then40mg  PO daily x 2 days, then 20mg  PO daily x 3 days   promethazine -dextromethorphan (PROMETHAZINE -DM) 6.25-15 MG/5ML syrup Take 5 mLs by mouth 4 (four) times daily as needed.   traZODone  (DESYREL ) 50 MG tablet Take 0.5-1 tablets (25-50 mg total) by mouth at bedtime as needed for sleep.   [DISCONTINUED] Vitamin D , Ergocalciferol , (DRISDOL ) 1.25 MG (50000 UNIT) CAPS capsule TAKE 1 CAPSULE BY MOUTH EVERY 7 DAYS (Patient not taking: Reported on  08/11/2024)   No facility-administered medications prior to visit.   Reviewed past medical and social history.   ROS per HPI above      Objective:  BP 122/78 (BP Location: Left Arm, Patient Position: Sitting, Cuff Size: Normal)   Pulse 80   Temp 98.3 F (36.8 C) (Oral)   Ht 5' 8 (1.727 m)   Wt 167 lb 9.6 oz (76 kg)   LMP 08/06/2018   SpO2 97%   BMI 25.48 kg/m      Physical Exam Constitutional:      General: She is not in acute distress. HENT:     Right Ear: Tympanic membrane, ear canal and external ear normal.     Left Ear: Tympanic membrane, ear canal and external ear normal.     Nose: Congestion and rhinorrhea present. No nasal tenderness or mucosal edema.     Right Nostril: No occlusion.     Left Nostril: No occlusion.     Right Turbinates: Not enlarged, swollen or pale.     Left Turbinates: Not enlarged, swollen or pale.     Right Sinus: No maxillary sinus tenderness or frontal sinus tenderness.     Left Sinus: No maxillary sinus tenderness or frontal sinus tenderness.     Mouth/Throat:     Pharynx: Oropharynx is clear. Uvula midline.     Tonsils: No tonsillar exudate or tonsillar abscesses.  Cardiovascular:     Rate and Rhythm: Normal rate and regular rhythm.  Pulses: Normal pulses.     Heart sounds: Normal heart sounds.  Pulmonary:     Effort: Pulmonary effort is normal.     Breath sounds: Wheezing present.     Comments: Clear lung sounds post nebulizer treatment Musculoskeletal:     Cervical back: Normal range of motion and neck supple.  Lymphadenopathy:     Cervical: No cervical adenopathy.  Neurological:     Mental Status: She is alert and oriented to person, place, and time.     No results found for any visits on 08/11/24.    Assessment & Plan:    Problem List Items Addressed This Visit     Acute bronchitis - Primary   Chest and sinus congestion x 3weeks, evaluated by urgent care provider 1week ago, she started augmentin  and oral prednisone   5days ago, use of promethazine  Dm at hs only due to sedating side effect. No fever, no night sweats, but has persistent productive cough in the daytime. Has albuterol  inhaler at home but has not used it in last 1week.  Duoneb administered in office due to wheezing and cough- resolved post treatment. Added azithromycin  Prescription, and  mucinex Dm OVER THE COUNTER for cough in daytime Advised to completed augmentin  and oral prednisone  Start symbicort  after completion of oral prednisone . F/up with pcp in 1week if no improvement      Relevant Medications   budesonide -formoterol  (SYMBICORT ) 160-4.5 MCG/ACT inhaler   azithromycin  (ZITHROMAX  Z-PAK) 250 MG tablet   Tobacco use   Started at age 52 Smokes 1pack per week. Never quit in the past      Relevant Medications   nicotine  (NICODERM CQ ) 14 mg/24hr patch   nicotine  (NICODERM CQ ) 7 mg/24hr patch (Start on 09/10/2024)   Return if symptoms worsen or fail to improve.     Roselie Mood, NP

## 2024-08-11 NOTE — Assessment & Plan Note (Signed)
 Started at age 38 Smokes 1pack per week. Never quit in the past

## 2024-08-11 NOTE — Patient Instructions (Signed)
 Complete augmentin  and oral prednisone  as prescribed Use albuterol  inhaler every 4-6hrs as needed Start mucinex Dm or robitussin for cough in the daytime  Continue promethazine  Dm for cough at nighttime Do not resume tobacco use Start nicoderm patches  Acute Bronchitis, Adult  Acute bronchitis is when air tubes in the lungs (bronchi) suddenly get swollen. The condition can make it hard for you to breathe. In adults, acute bronchitis usually goes away within 2 weeks. A cough caused by bronchitis may last up to 3 weeks. Smoking, allergies, and asthma can make the condition worse. What are the causes? Germs that cause cold and flu (viruses). The most common cause of this condition is the virus that causes the common cold. Bacteria. Substances that bother (irritate) the lungs, including: Smoke from cigarettes and other types of tobacco. Dust and pollen. Fumes from chemicals, gases, or burned fuel. Indoor or outdoor air pollution. What increases the risk? A weak body's defense system. This is also called the immune system. Any condition that affects your lungs and breathing, such as asthma. What are the signs or symptoms? A cough. Coughing up clear, yellow, or green mucus. Making high-pitched whistling sounds when you breathe, most often when you breathe out (wheezing). Runny or stuffy nose. Having too much mucus in your lungs (chest congestion). Shortness of breath. Body aches. A sore throat. How is this treated? Acute bronchitis may go away over time without treatment. Your doctor may tell you to: Drink more fluids. This will help thin your mucus so it is easier to cough up. Use a device that gets medicine into your lungs (inhaler). Use a vaporizer or a humidifier. These are machines that add water to the air. This helps with coughing and poor breathing. Take a medicine that thins mucus and helps clear it from your lungs. Take a medicine that prevents or stops coughing. It is not  common to take an antibiotic medicine for this condition. Follow these instructions at home:  Take over-the-counter and prescription medicines only as told by your doctor. Use an inhaler, vaporizer, or humidifier as told by your doctor. Take two teaspoons (10 mL) of honey at bedtime. This helps lessen your coughing at night. Drink enough fluid to keep your pee (urine) pale yellow. Do not smoke or use any products that contain nicotine  or tobacco. If you need help quitting, ask your doctor. Get a lot of rest. Return to your normal activities when your doctor says that it is safe. Keep all follow-up visits. How is this prevented?  Wash your hands often with soap and water for at least 20 seconds. If you cannot use soap and water, use hand sanitizer. Avoid contact with people who have cold symptoms. Try not to touch your mouth, nose, or eyes with your hands. Avoid breathing in smoke or chemical fumes. Make sure to get the flu shot every year. Contact a doctor if: Your symptoms do not get better in 2 weeks. You have trouble coughing up the mucus. Your cough keeps you awake at night. You have a fever. Get help right away if: You cough up blood. You have chest pain. You have very bad shortness of breath. You faint or keep feeling like you are going to faint. You have a very bad headache. Your fever or chills get worse. These symptoms may be an emergency. Get help right away. Call your local emergency services (911 in the [place] Do not wait to see if the symptoms will go away. Do not drive  yourself to the hospital. Summary Acute bronchitis is when air tubes in the lungs (bronchi) suddenly get swollen. In adults, acute bronchitis usually goes away within 2 weeks. Drink more fluids. This will help thin your mucus so it is easier to cough up. Take over-the-counter and prescription medicines only as told by your doctor. Contact a doctor if your symptoms do not improve after 2 weeks of  treatment. This information is not intended to replace advice given to you by your health care provider. Make sure you discuss any questions you have with your health care provider. Document Revised: 03/22/2021 Document Reviewed: 03/22/2021 Elsevier Patient Education  2024 [employer].

## 2024-08-18 ENCOUNTER — Ambulatory Visit: Admitting: Family Medicine

## 2024-08-21 ENCOUNTER — Telehealth: Admitting: Family Medicine

## 2024-08-21 DIAGNOSIS — B9689 Other specified bacterial agents as the cause of diseases classified elsewhere: Secondary | ICD-10-CM

## 2024-08-21 DIAGNOSIS — J019 Acute sinusitis, unspecified: Secondary | ICD-10-CM | POA: Diagnosis not present

## 2024-08-21 MED ORDER — PREDNISONE 20 MG PO TABS
20.0000 mg | ORAL_TABLET | Freq: Two times a day (BID) | ORAL | 0 refills | Status: AC
Start: 2024-08-21 — End: 2024-08-26

## 2024-08-21 NOTE — Progress Notes (Signed)
 Virtual Visit Consent   Zoya Sprecher, you are scheduled for a virtual visit with a [facility] provider today. Just as with appointments in the office, your consent must be obtained to participate. Your consent will be active for this visit and any virtual visit you may have with one of our providers in the next 365 days. If you have a MyChart account, a copy of this consent can be sent to you electronically.  As this is a virtual visit, video technology does not allow for your provider to perform a traditional examination. This may limit your provider's ability to fully assess your condition. If your provider identifies any concerns that need to be evaluated in person or the need to arrange testing (such as labs, EKG, etc.), we will make arrangements to do so. Although advances in technology are sophisticated, we cannot ensure that it will always work on either your end or our end. If the connection with a video visit is poor, the visit may have to be switched to a telephone visit. With either a video or telephone visit, we are not always able to ensure that we have a secure connection.  By engaging in this virtual visit, you consent to the provision of healthcare and authorize for your insurance to be billed (if applicable) for the services provided during this visit. Depending on your insurance coverage, you may receive a charge related to this service.  I need to obtain your verbal consent now. Are you willing to proceed with your visit today? Suzie Vandam has provided verbal consent on 08/21/2024 for a virtual visit (video or telephone). Loa Lamp, FNP  Date: 08/21/2024 1:30 PM   Virtual Visit via Video Note   I, Loa Lamp, connected with  Jaycelyn Orrison  ([id number], 12/29/1985) on 08/21/24 at  1:30 PM EDT by a video-enabled telemedicine application and verified that I am speaking with the correct person using two identifiers.  Location: Patient: Virtual Visit Location Patient:  Home Provider: Virtual Visit Location Provider: Home Office   I discussed the limitations of evaluation and management by telemedicine and the availability of in person appointments. The patient expressed understanding and agreed to proceed.    History of Present Illness: Sarah Greene is a 38 y.o. who identifies as a female who was assigned female at birth, and is being seen today for headache, sinus pressure and pain, thick green mucus, completed augmentin  and prednisone  last week and does not feel it fully resolved problem. No fever or cough. Sx for several weeks now.   HPI: HPI  Problems:  Patient Active Problem List   Diagnosis Date Noted   Acute bronchitis 08/11/2024   Hypovitaminosis D 04/02/2024   Right arm pain 12/12/2023   Atypical chest pain 02/12/2023   Depression, major, single episode, moderate (HCC) 02/12/2023   GAD (generalized anxiety disorder) 02/12/2023   Tobacco use 02/12/2023    Allergies: No Known Allergies Medications:  Current Outpatient Medications:    albuterol  (VENTOLIN  HFA) 108 (90 Base) MCG/ACT inhaler, Inhale 2 puffs into the lungs every 6 (six) hours as needed for wheezing or shortness of breath., Disp: 34 g, Rfl: 0   amoxicillin -clavulanate (AUGMENTIN ) 875-125 MG tablet, Take 1 tablet by mouth every 12 (twelve) hours., Disp: 14 tablet, Rfl: 0   azithromycin  (ZITHROMAX  Z-PAK) 250 MG tablet, Take 1 tablet (250 mg total) by mouth daily. Take 2tabs on first day, then 1tab once a day till complete, Disp: 6 tablet, Rfl: 0   budesonide -formoterol  (SYMBICORT ) 160-4.5 MCG/ACT  inhaler, Inhale 2 puffs into the lungs 2 (two) times daily. Rinse mouth after each use, Disp: 6 g, Rfl: 0   nicotine  (NICODERM CQ ) 14 mg/24hr patch, Place 1 patch (14 mg total) onto the skin daily., Disp: 28 patch, Rfl: 0   [START ON 09/10/2024] nicotine  (NICODERM CQ ) 7 mg/24hr patch, Place 1 patch (7 mg total) onto the skin daily., Disp: 28 patch, Rfl: 0   predniSONE  (DELTASONE ) 20 MG tablet,  Take 60mg  PO daily x 2 days, then40mg  PO daily x 2 days, then 20mg  PO daily x 3 days, Disp: 13 tablet, Rfl: 0   promethazine -dextromethorphan (PROMETHAZINE -DM) 6.25-15 MG/5ML syrup, Take 5 mLs by mouth 4 (four) times daily as needed., Disp: 118 mL, Rfl: 0   traZODone  (DESYREL ) 50 MG tablet, Take 0.5-1 tablets (25-50 mg total) by mouth at bedtime as needed for sleep., Disp: 30 tablet, Rfl: 3  Observations/Objective: Patient is well-developed, well-nourished in no acute distress.  Resting comfortably  at home.  Head is normocephalic, atraumatic.  No labored breathing.  Speech is clear and coherent with logical content.  Patient is alert and oriented at baseline.    Assessment and Plan: 1. Acute bacterial sinusitis (Primary)  Increase fluids, take allegra and flonase , humidifier at night UC if sx persist or worsen.   Follow Up Instructions: I discussed the assessment and treatment plan with the patient. The patient was provided an opportunity to ask questions and all were answered. The patient agreed with the plan and demonstrated an understanding of the instructions.  A copy of instructions were sent to the patient via MyChart unless otherwise noted below.     The patient was advised to call back or seek an in-person evaluation if the symptoms worsen or if the condition fails to improve as anticipated.    Kathleen Likins, FNP

## 2024-08-21 NOTE — Patient Instructions (Addendum)
 Sinus Infection, Adult A sinus infection, also called sinusitis, is inflammation of your sinuses. Sinuses are hollow spaces in the bones around your face. Your sinuses are located: Around your eyes. In the middle of your forehead. Behind your nose. In your cheekbones. Mucus normally drains out of your sinuses. When your nasal tissues become inflamed or swollen, mucus can become trapped or blocked. This allows bacteria, viruses, and fungi to grow, which leads to infection. Most infections of the sinuses are caused by a virus. A sinus infection can develop quickly. It can last for up to 4 weeks (acute) or for more than 12 weeks (chronic). A sinus infection often develops after a cold. What are the causes? This condition is caused by anything that creates swelling in the sinuses or stops mucus from draining. This includes: Allergies. Asthma. Infection from bacteria or viruses. Deformities or blockages in your nose or sinuses. Abnormal growths in the nose (nasal polyps). Pollutants, such as chemicals or irritants in the air. Infection from fungi. This is rare. What increases the risk? You are more likely to develop this condition if you: Have a weak body defense system (immune system). Do a lot of swimming or diving. Overuse nasal sprays. Smoke. What are the signs or symptoms? The main symptoms of this condition are pain and a feeling of pressure around the affected sinuses. Other symptoms include: Stuffy nose or congestion that makes it difficult to breathe through your nose. Thick yellow or greenish drainage from your nose. Tenderness, swelling, and warmth over the affected sinuses. A cough that may get worse at night. Decreased sense of smell and taste. Extra mucus that collects in the throat or the back of the nose (postnasal drip) causing a sore throat or bad breath. Tiredness (fatigue). Fever. How is this diagnosed? This condition is diagnosed based on: Your symptoms. Your  medical history. A physical exam. Tests to find out if your condition is acute or chronic. This may include: Checking your nose for nasal polyps. Viewing your sinuses using a device that has a light (endoscope). Testing for allergies or bacteria. Imaging tests, such as an MRI or CT scan. In rare cases, a bone biopsy may be done to rule out more serious types of fungal sinus disease. How is this treated? Treatment for a sinus infection depends on the cause and whether your condition is chronic or acute. If caused by a virus, your symptoms should go away on their own within 10 days. You may be given medicines to relieve symptoms. They include: Medicines that shrink swollen nasal passages (decongestants). A spray that eases inflammation of the nostrils (topical intranasal corticosteroids). Rinses that help get rid of thick mucus in your nose (nasal saline washes). Medicines that treat allergies (antihistamines). Over-the-counter pain relievers. If caused by bacteria, your health care provider may recommend waiting to see if your symptoms improve. Most bacterial infections will get better without antibiotic medicine. You may be given antibiotics if you have: A severe infection. A weak immune system. If caused by narrow nasal passages or nasal polyps, surgery may be needed. Follow these instructions at home: Medicines Take, use, or apply over-the-counter and prescription medicines only as told by your health care provider. These may include nasal sprays. If you were prescribed an antibiotic medicine, take it as told by your health care provider. Do not stop taking the antibiotic even if you start to feel better. Hydrate and humidify  Drink enough fluid to keep your urine pale yellow. Staying hydrated will help  to thin your mucus. Use a cool mist humidifier to keep the humidity level in your home above 50%. Inhale steam for 10-15 minutes, 3-4 times a day, or as told by your health care  provider. You can do this in the bathroom while a hot shower is running. Limit your exposure to cool or dry air. Rest Rest as much as possible. Sleep with your head raised (elevated). Make sure you get enough sleep each night. General instructions  Apply a warm, moist washcloth to your face 3-4 times a day or as told by your health care provider. This will help with discomfort. Use nasal saline washes as often as told by your health care provider. Wash your hands often with soap and water to reduce your exposure to germs. If soap and water are not available, use hand sanitizer. Do not smoke. Avoid being around people who are smoking (secondhand smoke). Keep all follow-up visits. This is important. Contact a health care provider if: You have a fever. Your symptoms get worse. Your symptoms do not improve within 10 days. Get help right away if: You have a severe headache. You have persistent vomiting. You have severe pain or swelling around your face or eyes. You have vision problems. You develop confusion. Your neck is stiff. You have trouble breathing. These symptoms may be an emergency. Get help right away. Call 911. Do not wait to see if the symptoms will go away. Do not drive yourself to the hospital. Summary A sinus infection is soreness and inflammation of your sinuses. Sinuses are hollow spaces in the bones around your face. This condition is caused by nasal tissues that become inflamed or swollen. The swelling traps or blocks the flow of mucus. This allows bacteria, viruses, and fungi to grow, which leads to infection. If you were prescribed an antibiotic medicine, take it as told by your health care provider. Do not stop taking the antibiotic even if you start to feel better. Keep all follow-up visits. This is important. This information is not intended to replace advice given to you by your health care provider. Make sure you discuss any questions you have with your health  care provider. Document Revised: 10/24/2021 Document Reviewed: 10/24/2021 Elsevier Patient Education  2024 [employer]???????? ???????????? ????? ? ???????? Sinus Infection, Adult ???????? ??????? ?????, ????? ?????????? ?????????, -- ??? ?????????? ????? ???????????? ?????. ?????? -- ??? ??????? ? ?????? ?????? ????. ???? ?????? ???????????: ?????? ????. ??????? ???. ?????? ????. ? ??????. ??? ????? ?????? ?????????? ?? ????? ?????. ????? ???? ??????? ????? ??????????? ??? ???????, ????? ????? ????? ?????????????, ????? ???? ?????? ?? ????? ????????? ? ??????. ??? ????????? ???????????? ?????????, ??????? ? ???????, ??? ???????? ? ???????? ????????. ??????????? ???????? ????? ???????? ??????. ???????? ???????????? ????? ????? ????????? ??????. ?? ????? ???????????? ?? 4 ?????? (??????) ??? ????? 12 ?????? (???????????). ???????? ??????? ????? ????? ??????????? ??? ????????. ?????? ???????? ??? ????????? ??????????? ????? ????????, ??????? ???????? ???? ?????? ? ??????? ??? ?????? ?????? ?????. ???? ?????????: ????????. ???????????? ?????. ????????, ????????? ?????????? ??? ????????. ??????? ?????????? ??? ????????? ? ???? ??? ???????. ?????????????? ??????????? ? ???? (??????? ??????). ??????????? ???????????? ? ??????? ?????????????, ????? ??? ???????? ??? ????????????. ????????, ????????? ????????. ??????????? ?????. ??? ???????? ????? ??????????? ????????? ????? ????????? ????, ???? ??: ?????? ??????????? ??????? ?????? ????????? (???????? ???????). ????? ???????? ??? ???????. ??????????????? ?????????? ???????. ???. ?????? ???????? ??? ????????? ???????? ???????? ????? ????????? -- ???? ? ??????? ???????? ? ??????? ??????????????? ?????. ??????? ?????????? ????????: ????????? ??? ???????????? ????, ???????????? ??????? ????? ???. ?????? ?????? ??? ??????????? ????????? ?? ????. ?????????????, ???? ? ?????????? ??? ??????????? ????????. ??????, ??????? ????? ???????????? ??  ?????. ????????? ???????? ? ???????? ????????. ???????? ?????, ??????? ???????????? ? ????? ??? ?? ?????? ?????? ??????? ???? (????????????? ?????), ?????????? ???? ? ????? ??? ?????????? ????? ??? ???. ????????? (?????????? ????????????). ?????????? ??????????? ????. ??? ??? ?????????????? ??? ????????? ??????????????? ? ??????: ????? ?????????. ?????? ???????????? ????????. ???????????? ????????????. ??????????? ????????, ??????????? ????????, ???????? ?? ???? ????????? ?????? ??? ???????????. ??? ????? ????????: ?????? ???? ?? ??????? ??????? ??????? ???????. ?????? ????? ????? ??????? ? ?????????????? ???????????????? ?????????? ? ?????????? ????? (?????????). ?????? ?? ???????? ??? ????????. ???????????????? ???????????? -- ??? ??? ?? (???????????? ??????????). ? ?????? ??????? ????? ???? ????????? ??????? ?????, ????? ????????? ????? ??????? ????????? ??????????? ?????. ??? ??? ?????? ??????? ???????? ??????? ????? ??????? ?? ?? ??????? ? ?? ????, ??????????? ??? ?????? ? ??? ?????????. ???? ??????? ? ??????, ???????? ?????? ????????? ?????????????? ? ???????  10 ????. ??? ??????????? ????????? ??? ????? ????????? ?????????. ? ??? ?????????: ????????????? ?????????, ??????????? ???? ??????? ????? (???????????????? ????????). ?????, ??????? ??????? ?????????? ??????? (?????????????? ??????????????? ??? ???????? ??????????). ??????????????, ??????? ???????? ?????????? ?? ?????? ????? ? ???? (??????? ???????? ??? ????). ????????????? ?????????, ??????? ????? ???????? (??????????????? ?????????). ?????????????? ?????????????? ?????????. ???? ???? ????????? ??????????? ??????????, ??? ??????? ???? ????? ????????????? ????????? ????????? ????????????. ??????????? ????????????? ???????? ???????? ??? ?????????? ????????????. ??? ????? ????????? ???????????, ???? ? ???: ??????? ????????. ??????????? ???????? ???????. ???? ???????? ???????? ????? ??????? ???? ??? ??????? ??????, ?????  ????????????? ????????????? ????????. ? ???????? ???????? ???????? ????? ????????????: ????????????? ????????? ??????????, ??????????? ??? ?????????? ?????????????? ? ??????????? ????????? ?????? ? ???????????? ? ?????????? ?????. ??? ????? ???????? ?????????? ????????? ??????. ???? ??? ????????? ??????????, ?????????? ??? ??? ??????? ????? ??????? ??????. ?? ??????????? ????? ???????????, ???? ???? ?? ??????? ??????????? ???? ?????. ?????????? ? ??????????  ????? ?????????? ????????, ????? ???? ???? ?????????? ??????-??????? ?????. ??????????? ?????????? ???????? ? ????????? ???????? ?????????? ?????. ????? ?????????????? ????????? ??????? ? ????? ???? ???? 50 %, ??????????? ??????????? ???????, ???????????? ???????? ???. ???????? ??? ? ??????? 10-15 ????? 3-4 ???? ? ????, ??? ??? ??????? ????? ??????? ??????. ?? ?????? ??????? ??? ? ??????, ??????? ??????? ???. ?????????? ??????????? ????????? ??? ?????? ???????. ????? ??? ????? ?????? ?????????. ????? ? ???????? (???????????) ???????. ??????????? ??????????? ?????? ????. ????? ????????  ????????????? ? ???? ??????, ??????? ???????? ?? ?????????? ????? 3-4 ???? ? ????, ??? ? ???????????? ? ?????????? ?????. ??? ???????? ?????????? ? ????????????. ?????????? ??????? ??????? ??? ?????????? ???? ??? ?????, ??? ??????? ????? ??????? ??????. ????? ????? ???? ????? ? ?????, ????? ???????? ???????? ? ?????????. ???? ???? ? ???? ??????????, ??????????? ??????????????? ???????? ??? ???. ?? ??????. ????????? ?????????? ????? ? ??????, ??????? ????? (???????? ?????????? ???????). ????????? ?? ??? ?????? ???????????? ??????????. ??? ?????. ?????????? ? ???????? ?????, ????: ? ??? ?????????. ???? ???????? ????????????. ???????????? ? ??? ?? ?????????? ? ??????? 10 ????. ?????????? ?????????? ?? ???????, ????: ? ??? ????????? ??????? ???????? ????. ? ??? ?????????? ??????? ?????. ? ??? ????????? ??????? ???? ??? ???? ? ??????? ???? ??? ????. ? ???  ????????? ????????? ??????. ? ??? ????????? ??????????? ????????. ? ??? ?????????? ??????????????? ? ??????? ???. ? ??? ???????????? ???????. ??? ???????? ????? ????????? ?? ????????????? ?????????? ??????????? ??????. ?????????? ?????????? ?? ???????. ????????? ?? ?????? 911. ?? ?????, ????? ??????, ???????? ?? ????????. ?? ???????? ?? ????, ????? ?????????????? ????????? ?? ????????. ?????? ???????? ???????????? ????? -- ??? ????????????? ? ?????????? ????? ???????????? ?????. ?????? -- ??? ??????? ? ?????? ?????? ????. ??? ????????? ??????? ??????????? ??? ?????? ??????? ??????. ???? ?????????? ??? ????????? ????? ?????. ??? ????????? ???????????? ?????????, ??????? ? ???????, ??? ???????? ? ???????? ????????. ???? ??? ????????? ??????????, ?????????? ??? ??? ??????? ????? ??????? ??????. ?? ??????????? ????? ???????????, ???? ???? ?? ??????? ??????????? ???? ?????. ????????? ?? ??? ?????? ???????????? ??????????. ??? ?????. ??? ?????????? ?? ????? ???????? ??????, ??????????????? ????? ??????. ??????????? ???????? ????? ???????????? ??? ??????? ? ????? ??????? ??????. Document Revised: 11/09/2021 Document Reviewed: 11/09/2021 Elsevier Patient Education  2024 [employer].

## 2024-10-06 ENCOUNTER — Ambulatory Visit: Admitting: Family Medicine

## 2024-11-17 ENCOUNTER — Ambulatory Visit: Admitting: Family Medicine

## 2024-11-30 ENCOUNTER — Ambulatory Visit: Admitting: Podiatry

## 2024-12-17 ENCOUNTER — Ambulatory Visit: Admitting: Podiatry

## 2025-01-04 ENCOUNTER — Inpatient Hospital Stay: Admission: RE | Admit: 2025-01-04 | Discharge: 2025-01-04 | Attending: Family Medicine

## 2025-01-04 VITALS — BP 134/87 | HR 85 | Temp 98.3°F | Resp 16

## 2025-01-04 DIAGNOSIS — M549 Dorsalgia, unspecified: Secondary | ICD-10-CM

## 2025-01-04 MED ORDER — TIZANIDINE HCL 4 MG PO TABS: 4.0000 mg | ORAL_TABLET | Freq: Three times a day (TID) | ORAL | 0 refills | Status: AC | PRN

## 2025-01-04 MED ORDER — KETOROLAC TROMETHAMINE 30 MG/ML IJ SOLN
30.0000 mg | Freq: Once | INTRAMUSCULAR | Status: AC
  Administered 2025-01-04: 30 mg via INTRAMUSCULAR

## 2025-01-04 NOTE — Discharge Instructions (Addendum)
 You were seen today for upper back pain with muscle spasm.  I have given you a shot of toradol  today for pain.  I have sent out a muscle relaxer to your pharmacy as well.  This may make you tired/drowsy so please take when home and not driving.  You may use motrin /advil  for pain, and use a heating pad.  If you are not improving or worsening despite treatment then please be seen for imaging.
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
